# Patient Record
Sex: Male | Born: 1945 | Race: White | Hispanic: No | Marital: Married | Smoking: Never smoker
Health system: Southern US, Community
[De-identification: ages and names within clinical notes are randomized; demographics above are authoritative.]

## PROBLEM LIST (undated history)

## (undated) DIAGNOSIS — M316 Other giant cell arteritis: Secondary | ICD-10-CM

## (undated) DIAGNOSIS — I10 Essential (primary) hypertension: Secondary | ICD-10-CM

## (undated) DIAGNOSIS — E785 Hyperlipidemia, unspecified: Secondary | ICD-10-CM

## (undated) HISTORY — PX: NO PAST SURGERIES: SHX2092

---

## 2014-04-17 ENCOUNTER — Encounter (HOSPITAL_COMMUNITY): Payer: Self-pay | Admitting: Internal Medicine

## 2014-04-17 ENCOUNTER — Emergency Department (HOSPITAL_COMMUNITY): Payer: Medicare Other

## 2014-04-17 ENCOUNTER — Encounter (HOSPITAL_COMMUNITY): Admission: EM | Disposition: E | Payer: Self-pay | Source: Home / Self Care | Attending: Internal Medicine

## 2014-04-17 ENCOUNTER — Inpatient Hospital Stay (HOSPITAL_COMMUNITY)
Admission: EM | Admit: 2014-04-17 | Discharge: 2014-05-03 | DRG: 246 | Disposition: E | Payer: Medicare Other | Attending: Internal Medicine | Admitting: Internal Medicine

## 2014-04-17 DIAGNOSIS — Z79899 Other long term (current) drug therapy: Secondary | ICD-10-CM

## 2014-04-17 DIAGNOSIS — G40901 Epilepsy, unspecified, not intractable, with status epilepticus: Secondary | ICD-10-CM

## 2014-04-17 DIAGNOSIS — R6520 Severe sepsis without septic shock: Secondary | ICD-10-CM

## 2014-04-17 DIAGNOSIS — Z66 Do not resuscitate: Secondary | ICD-10-CM | POA: Diagnosis not present

## 2014-04-17 DIAGNOSIS — I5021 Acute systolic (congestive) heart failure: Secondary | ICD-10-CM | POA: Diagnosis present

## 2014-04-17 DIAGNOSIS — R001 Bradycardia, unspecified: Secondary | ICD-10-CM

## 2014-04-17 DIAGNOSIS — J690 Pneumonitis due to inhalation of food and vomit: Secondary | ICD-10-CM | POA: Diagnosis not present

## 2014-04-17 DIAGNOSIS — R4020 Unspecified coma: Secondary | ICD-10-CM | POA: Diagnosis present

## 2014-04-17 DIAGNOSIS — E872 Acidosis, unspecified: Secondary | ICD-10-CM | POA: Diagnosis present

## 2014-04-17 DIAGNOSIS — E876 Hypokalemia: Secondary | ICD-10-CM | POA: Diagnosis not present

## 2014-04-17 DIAGNOSIS — G931 Anoxic brain damage, not elsewhere classified: Secondary | ICD-10-CM | POA: Diagnosis present

## 2014-04-17 DIAGNOSIS — R34 Anuria and oliguria: Secondary | ICD-10-CM | POA: Diagnosis not present

## 2014-04-17 DIAGNOSIS — G40401 Other generalized epilepsy and epileptic syndromes, not intractable, with status epilepticus: Secondary | ICD-10-CM | POA: Diagnosis not present

## 2014-04-17 DIAGNOSIS — I2589 Other forms of chronic ischemic heart disease: Secondary | ICD-10-CM | POA: Diagnosis present

## 2014-04-17 DIAGNOSIS — I2109 ST elevation (STEMI) myocardial infarction involving other coronary artery of anterior wall: Secondary | ICD-10-CM

## 2014-04-17 DIAGNOSIS — I495 Sick sinus syndrome: Secondary | ICD-10-CM

## 2014-04-17 DIAGNOSIS — IMO0002 Reserved for concepts with insufficient information to code with codable children: Secondary | ICD-10-CM

## 2014-04-17 DIAGNOSIS — I498 Other specified cardiac arrhythmias: Secondary | ICD-10-CM | POA: Diagnosis not present

## 2014-04-17 DIAGNOSIS — I4901 Ventricular fibrillation: Principal | ICD-10-CM | POA: Diagnosis present

## 2014-04-17 DIAGNOSIS — I472 Ventricular tachycardia, unspecified: Secondary | ICD-10-CM | POA: Diagnosis not present

## 2014-04-17 DIAGNOSIS — Z8249 Family history of ischemic heart disease and other diseases of the circulatory system: Secondary | ICD-10-CM

## 2014-04-17 DIAGNOSIS — J9600 Acute respiratory failure, unspecified whether with hypoxia or hypercapnia: Secondary | ICD-10-CM | POA: Diagnosis present

## 2014-04-17 DIAGNOSIS — M316 Other giant cell arteritis: Secondary | ICD-10-CM | POA: Diagnosis present

## 2014-04-17 DIAGNOSIS — E785 Hyperlipidemia, unspecified: Secondary | ICD-10-CM | POA: Diagnosis present

## 2014-04-17 DIAGNOSIS — J9601 Acute respiratory failure with hypoxia: Secondary | ICD-10-CM

## 2014-04-17 DIAGNOSIS — R7309 Other abnormal glucose: Secondary | ICD-10-CM | POA: Diagnosis present

## 2014-04-17 DIAGNOSIS — T380X5A Adverse effect of glucocorticoids and synthetic analogues, initial encounter: Secondary | ICD-10-CM | POA: Diagnosis present

## 2014-04-17 DIAGNOSIS — I2510 Atherosclerotic heart disease of native coronary artery without angina pectoris: Secondary | ICD-10-CM

## 2014-04-17 DIAGNOSIS — R6521 Severe sepsis with septic shock: Secondary | ICD-10-CM

## 2014-04-17 DIAGNOSIS — I469 Cardiac arrest, cause unspecified: Secondary | ICD-10-CM

## 2014-04-17 DIAGNOSIS — I2582 Chronic total occlusion of coronary artery: Secondary | ICD-10-CM | POA: Diagnosis present

## 2014-04-17 DIAGNOSIS — I213 ST elevation (STEMI) myocardial infarction of unspecified site: Secondary | ICD-10-CM

## 2014-04-17 DIAGNOSIS — I4729 Other ventricular tachycardia: Secondary | ICD-10-CM | POA: Diagnosis not present

## 2014-04-17 DIAGNOSIS — I10 Essential (primary) hypertension: Secondary | ICD-10-CM | POA: Diagnosis present

## 2014-04-17 DIAGNOSIS — R570 Cardiogenic shock: Secondary | ICD-10-CM | POA: Diagnosis not present

## 2014-04-17 DIAGNOSIS — A419 Sepsis, unspecified organism: Secondary | ICD-10-CM

## 2014-04-17 DIAGNOSIS — J15211 Pneumonia due to Methicillin susceptible Staphylococcus aureus: Secondary | ICD-10-CM | POA: Diagnosis not present

## 2014-04-17 DIAGNOSIS — Z515 Encounter for palliative care: Secondary | ICD-10-CM

## 2014-04-17 HISTORY — DX: Hyperlipidemia, unspecified: E78.5

## 2014-04-17 HISTORY — DX: Other giant cell arteritis: M31.6

## 2014-04-17 HISTORY — DX: Essential (primary) hypertension: I10

## 2014-04-17 HISTORY — PX: LEFT HEART CATHETERIZATION WITH CORONARY ANGIOGRAM: SHX5451

## 2014-04-17 LAB — PROTIME-INR
INR: 1.13 (ref 0.00–1.49)
Prothrombin Time: 14.3 seconds (ref 11.6–15.2)

## 2014-04-17 LAB — BASIC METABOLIC PANEL
BUN: 14 mg/dL (ref 6–23)
CO2: 14 mEq/L — ABNORMAL LOW (ref 19–32)
Calcium: 8.2 mg/dL — ABNORMAL LOW (ref 8.4–10.5)
Chloride: 103 mEq/L (ref 96–112)
Creatinine, Ser: 1.24 mg/dL (ref 0.50–1.35)
GFR calc Af Amer: 68 mL/min — ABNORMAL LOW (ref >90)
GFR calc non Af Amer: 58 mL/min — ABNORMAL LOW (ref >90)
GLUCOSE: 253 mg/dL — AB (ref 70–99)
POTASSIUM: 3.3 meq/L — AB (ref 3.7–5.3)
Sodium: 141 mEq/L (ref 137–147)

## 2014-04-17 LAB — CBC
HEMATOCRIT: 36.7 % — AB (ref 39.0–52.0)
Hemoglobin: 12 g/dL — ABNORMAL LOW (ref 13.0–17.0)
MCH: 32.3 pg (ref 26.0–34.0)
MCHC: 32.7 g/dL (ref 30.0–36.0)
MCV: 98.9 fL (ref 78.0–100.0)
Platelets: 338 10*3/uL (ref 150–400)
RBC: 3.71 MIL/uL — AB (ref 4.22–5.81)
RDW: 14.2 % (ref 11.5–15.5)
WBC: 15.3 10*3/uL — AB (ref 4.0–10.5)

## 2014-04-17 LAB — APTT: aPTT: 34 seconds (ref 24–37)

## 2014-04-17 LAB — CBG MONITORING, ED: Glucose-Capillary: 220 mg/dL — ABNORMAL HIGH (ref 70–99)

## 2014-04-17 SURGERY — LEFT HEART CATHETERIZATION WITH CORONARY ANGIOGRAM
Anesthesia: LOCAL

## 2014-04-17 MED ORDER — HEPARIN SODIUM (PORCINE) 1000 UNIT/ML IJ SOLN
INTRAMUSCULAR | Status: AC
  Filled 2014-04-17: qty 1

## 2014-04-17 MED ORDER — CISATRACURIUM BOLUS VIA INFUSION
0.0500 mg/kg | INTRAVENOUS | Status: DC | PRN
  Filled 2014-04-17: qty 5

## 2014-04-17 MED ORDER — NOREPINEPHRINE BITARTRATE 1 MG/ML IV SOLN: 0.5000 ug/min | INTRAVENOUS | Status: DC

## 2014-04-17 MED ORDER — SODIUM CHLORIDE 0.9 % IV SOLN
2000.0000 mL | Freq: Once | INTRAVENOUS | Status: AC
  Administered 2014-04-18: 2000 mL via INTRAVENOUS

## 2014-04-17 MED ORDER — SODIUM CHLORIDE 0.9 % IV SOLN: 1.0000 ug/kg/min | INTRAVENOUS | Status: DC

## 2014-04-17 MED ORDER — METHYLPREDNISOLONE SODIUM SUCC 125 MG IJ SOLR
125.0000 mg | Freq: Once | INTRAMUSCULAR | Status: AC
  Filled 2014-04-17: qty 2

## 2014-04-17 MED ORDER — FENTANYL CITRATE 0.05 MG/ML IJ SOLN
INTRAMUSCULAR | Status: AC
  Filled 2014-04-17: qty 2

## 2014-04-17 MED ORDER — MIDAZOLAM HCL 5 MG/ML IJ SOLN
2.0000 mg | Freq: Once | INTRAMUSCULAR | Status: AC | PRN
Start: 2014-04-17 — End: 2014-04-17

## 2014-04-17 MED ORDER — SODIUM CHLORIDE 0.9 % IV SOLN
1.0000 ug/kg/min | INTRAVENOUS | Status: DC
Start: 2014-04-17 — End: 2014-04-18

## 2014-04-17 MED ORDER — CISATRACURIUM BOLUS VIA INFUSION: 0.0500 mg/kg | INTRAVENOUS | Status: DC | PRN

## 2014-04-17 MED ORDER — CISATRACURIUM BOLUS VIA INFUSION
0.1000 mg/kg | Freq: Once | INTRAVENOUS | Status: AC
  Administered 2014-04-17: 10 mg via INTRAVENOUS
  Filled 2014-04-17: qty 10

## 2014-04-17 MED ORDER — METHYLPREDNISOLONE SODIUM SUCC 125 MG IJ SOLR
INTRAMUSCULAR | Status: AC
  Filled 2014-04-17: qty 2

## 2014-04-17 MED ORDER — HEPARIN SODIUM (PORCINE) 5000 UNIT/ML IJ SOLN
INTRAMUSCULAR | Status: AC
  Administered 2014-04-17: 5000 [IU]
  Filled 2014-04-17: qty 1

## 2014-04-17 MED ORDER — SODIUM CHLORIDE 0.9 % IV SOLN
1.0000 ug/kg/min | INTRAVENOUS | Status: DC
  Administered 2014-04-18: 1 ug/kg/min via INTRAVENOUS
  Filled 2014-04-17: qty 20

## 2014-04-17 MED ORDER — FENTANYL BOLUS VIA INFUSION
50.0000 ug | INTRAVENOUS | Status: DC | PRN
  Filled 2014-04-17: qty 50

## 2014-04-17 MED ORDER — MIDAZOLAM BOLUS VIA INFUSION
2.0000 mg | INTRAVENOUS | Status: DC | PRN
  Filled 2014-04-17: qty 2

## 2014-04-17 MED ORDER — HYDROCORTISONE NA SUCCINATE PF 100 MG IJ SOLR
50.0000 mg | Freq: Four times a day (QID) | INTRAMUSCULAR | Status: DC
Start: 2014-04-17 — End: 2014-04-18
  Administered 2014-04-18: 50 mg via INTRAVENOUS
  Filled 2014-04-17 (×6): qty 1

## 2014-04-17 MED ORDER — VERAPAMIL HCL 2.5 MG/ML IV SOLN
INTRAVENOUS | Status: AC
  Filled 2014-04-17: qty 2

## 2014-04-17 MED ORDER — CISATRACURIUM BOLUS VIA INFUSION
0.1000 mg/kg | Freq: Once | INTRAVENOUS | Status: DC
  Filled 2014-04-17: qty 10

## 2014-04-17 MED ORDER — CISATRACURIUM BOLUS VIA INFUSION: 0.1000 mg/kg | Freq: Once | INTRAVENOUS | Status: DC

## 2014-04-17 MED ORDER — FENTANYL CITRATE 0.05 MG/ML IJ SOLN
100.0000 ug | Freq: Once | INTRAMUSCULAR | Status: AC
  Administered 2014-04-17: 100 ug via INTRAVENOUS

## 2014-04-17 MED ORDER — SODIUM CHLORIDE 0.9 % IV SOLN
25.0000 ug/h | INTRAVENOUS | Status: DC
  Administered 2014-04-18: 200 ug/h via INTRAVENOUS
  Administered 2014-04-18: 100 ug/h via INTRAVENOUS
  Administered 2014-04-19: 200 ug/h via INTRAVENOUS
  Filled 2014-04-17 (×4): qty 50

## 2014-04-17 MED ORDER — ARTIFICIAL TEARS OP OINT
1.0000 "application " | TOPICAL_OINTMENT | Freq: Three times a day (TID) | OPHTHALMIC | Status: DC
  Administered 2014-04-18 – 2014-04-19 (×5): 1 via OPHTHALMIC
  Filled 2014-04-17 (×2): qty 3.5

## 2014-04-17 MED ORDER — SODIUM CHLORIDE 0.9 % IV SOLN
2000.0000 mL | Freq: Once | INTRAVENOUS | Status: AC
  Administered 2014-04-17: 2000 mL via INTRAVENOUS

## 2014-04-17 MED ORDER — HEPARIN (PORCINE) IN NACL 2-0.9 UNIT/ML-% IJ SOLN
INTRAMUSCULAR | Status: AC
  Filled 2014-04-17: qty 1000

## 2014-04-17 MED ORDER — MIDAZOLAM HCL 2 MG/2ML IJ SOLN
INTRAMUSCULAR | Status: AC
  Filled 2014-04-17: qty 2

## 2014-04-17 MED ORDER — LIDOCAINE HCL (PF) 1 % IJ SOLN
INTRAMUSCULAR | Status: AC
  Filled 2014-04-17: qty 30

## 2014-04-17 MED ORDER — NOREPINEPHRINE BITARTRATE 1 MG/ML IV SOLN
0.0000 ug/min | INTRAVENOUS | Status: DC
  Administered 2014-04-19: 7 ug/min via INTRAVENOUS
  Administered 2014-04-19: 2 ug/min via INTRAVENOUS
  Administered 2014-04-19: 50 ug/min via INTRAVENOUS
  Filled 2014-04-17 (×4): qty 4

## 2014-04-17 MED ORDER — SODIUM CHLORIDE 0.9 % IV SOLN
1.0000 mg/h | INTRAVENOUS | Status: DC
  Administered 2014-04-18 (×2): 2 mg/h via INTRAVENOUS
  Administered 2014-04-18: 4 mg/h via INTRAVENOUS
  Filled 2014-04-17 (×6): qty 10

## 2014-04-17 MED ORDER — TIROFIBAN HCL IV 5 MG/100ML
INTRAVENOUS | Status: AC
  Filled 2014-04-17: qty 100

## 2014-04-17 MED ORDER — PANTOPRAZOLE SODIUM 40 MG IV SOLR
40.0000 mg | INTRAVENOUS | Status: DC
  Administered 2014-04-18 (×2): 40 mg via INTRAVENOUS
  Filled 2014-04-17 (×4): qty 40

## 2014-04-17 MED ORDER — FENTANYL CITRATE 0.05 MG/ML IJ SOLN: 100.0000 ug | Freq: Once | INTRAMUSCULAR | Status: AC | PRN

## 2014-04-17 MED ORDER — ASPIRIN 300 MG RE SUPP
300.0000 mg | RECTAL | Status: AC
  Administered 2014-04-17: 300 mg via RECTAL
  Filled 2014-04-17 (×2): qty 1

## 2014-04-17 MED ORDER — MIDAZOLAM HCL 5 MG/ML IJ SOLN
2.0000 mg | Freq: Once | INTRAMUSCULAR | Status: DC
Start: 2014-04-17 — End: 2014-04-18

## 2014-04-17 MED ORDER — FENTANYL CITRATE 0.05 MG/ML IJ SOLN: 100.0000 ug | Freq: Once | INTRAMUSCULAR | Status: DC

## 2014-04-17 NOTE — ED Notes (Signed)
Per EMS: pt coming from home with c/o cardiac arrest. Wife heard patient fall in shower, started CPR, EMS arrived at 2158 with in approximately 8 minutes of 911 call, EMS found pt in VF, shocked 200, 300, 360 joules. EMS CPR started at 2200 and ROSC at 2205. Pt lost pulses again, shocked again then ROSC at 2210. Pt was given epi x 2, 300 mg amio. IO in left tib, pt intubated with ET #8

## 2014-04-17 NOTE — H&P (Signed)
PULMONARY / CRITICAL CARE MEDICINE   Name: Andre Moran MRN: [id number] DOB: [birth year]    ADMISSION DATE:  01/07/14 CONSULTATION DATE:  01/07/14  REFERRING MD :  EDP PRIMARY SERVICE: PCCM  CHIEF COMPLAINT:  VF cardiac arrest  BRIEF PATIENT DESCRIPTION: 68 y.o. M who suffered v.fib arrest at home with ROSC in 15 minutes.  Brought to ED, GCS 3 and found to have STEMI.  Cardiology to take pt to cath lab tonight.  PCCM consulted for admission / initiation of hypothermia.  SIGNIFICANT EVENTS / STUDIES:  6/15 - suffered out of hospital v.fib arrest.  Shocked 4 times by EMS.  Given epi x 2, 300mg  amio.  ROSC 15 min.  Hypothermia protocol started and pt taken to cath lab.  LINES / TUBES: OETT 6/15 >>> OGT 6/15 >>> Foley 6/15 >>> Central line planned 6/16  >>> A-line planned >>>  CULTURES: None   ANTIBIOTICS: None  HISTORY OF PRESENT ILLNESS:  Pt is encephalopathic; therefore, this HPI is obtained from chart review. Andre Moran is a 68 y.o. M with PMH of HTN, HLD, temporal arteritis (on 8mg  prednisone daily and methotrexate).  Per his wife, if pt has temporal arteritis flair, he usually has jaw pain mainly with chewing.  On 6/15, he had been complaining of left jaw pain that was not like his usual temporal arteritis pain.  He had no chest pain, SOB, N/V, diaphoresis. Later that evening, wife was in shower and suddenly heard a loud noise.  After getting out of shower, she noticed her husband was down and unresponsive.  She immediately began CPR and called EMS.  EMS arrived within 8 minutes of call and found pt to be in VF.  He was shocked 3 times with ROSC in 8 minutes.  He then proceeded to lose pulses and was shocked again with next ROSC in 5 minutes.  During resuscitation, he was given epi x 2 and amiodarone 300mg . In ED, EKG revealed acute STEMI.  Cardiology was consulted and they will take pt to cath lab STAT.  GCS in ED was 3 and pt was subsequently started on hypothermia protocol.   PCCM was consulted for admission.  PAST MEDICAL HISTORY :  No past medical history on file. No past surgical history on file. Prior to Admission medications   Not on File   Allergies not on file  FAMILY HISTORY:  No family history on file. SOCIAL HISTORY:  has no tobacco, alcohol, and drug history on file.  REVIEW OF SYSTEMS:  Unable to complete as pt is non-responsive.   SUBJECTIVE: Vitals stable.  Cooling started, pt getting ready to be transported to cath lab.  VITAL SIGNS: Temp:  [95.2 F (35.1 C)-98.3 F (36.8 C)] 95.2 F (35.1 C) (06/15 2300) Pulse Rate:  [79-81] 79 (06/15 2300) Resp:  [24-27] 27 (06/15 2300) BP: (120-122)/(74-79) 122/79 mmHg (06/15 2300) SpO2:  [97 %-100 %] 97 % (06/15 2300) FiO2 (%):  [60 %] 60 % (06/15 2300) Weight:  [99.791 kg (220 lb)] 99.791 kg (220 lb) (06/15 2245) HEMODYNAMICS:   VENTILATOR SETTINGS: Vent Mode:  [-] PRVC FiO2 (%):  [60 %] 60 % Set Rate:  [18 bmp] 18 bmp Vt Set:  [550 mL] 550 mL PEEP:  [5 cmH20] 5 cmH20 INTAKE / OUTPUT: Intake/Output   None     PHYSICAL EXAMINATION: General: WDWN male, unresponsive in stretcher. Neuro: GCS 3. HEENT: /AT. PERRL, sclerae anicteric. Cardiovascular: RRR, no M/R/G.  Lungs: Respirations even and unlabored.  CTA bilaterally, No  W/R/R.  Abdomen: BS x 4, soft, NT/ND.  Musculoskeletal: No gross deformities, no edema.  IO in LLE. Skin: Intact, warm, no rashes.    LABS: PULMONARY No results found for this basename: PHART, PCO2, PCO2ART, PO2, PO2ART, HCO3, TCO2, O2SAT,  in the last 168 hours  CBC  Recent Labs Lab 05-01-2014 2254  HGB 12.0*  HCT 36.7*  WBC 15.3*  PLT 338    COAGULATION  Recent Labs Lab 05/01/14 2254  INR 1.13    CARDIAC  No results found for this basename: TROPONINI,  in the last 168 hours No results found for this basename: PROBNP,  in the last 168 hours   CHEMISTRY  Recent Labs Lab 01-May-2014 2254  NA 141  K 3.3*  CL 103  CO2 14*  GLUCOSE  253*  BUN 14  CREATININE 1.24  CALCIUM 8.2*   Estimated Creatinine Clearance: 71.9 ml/min (by C-G formula based on Cr of 1.24).   LIVER  Recent Labs Lab May 01, 2014 2254  INR 1.13     INFECTIOUS No results found for this basename: LATICACIDVEN, PROCALCITON,  in the last 168 hours   ENDOCRINE CBG (last 3)   Recent Labs  05-01-2014 2243  GLUCAP 220*         IMAGING x48h  No results found.     ASSESSMENT / PLAN:  PULMONARY A: Acute respiratory failure - in setting of v.fib cardiac arrest P:   - Full mechanical support. - VAP bundle. - No SBT while undergoing hypothermia protocol. - ABG and CXR in AM.  CARDIOVASCULAR A:  S/p V.Fib arrest STEMI Hx HTN Hx HLD Hx temporal arteritis - on prednisone daily, see endocrine section below. P:  - Cards consulted, will take pt to cath lab tonight.  Appreciate the assistance. - Full dose heparin. - Initiate hypothermia protocol. - Goal MAP > 85 during cooling. - Trend troponin / lactate. - TTE.  RENAL A:   No known issues. P:   - F/u on BMP results. - BMP q2hrs while cooling.  GASTROINTESTINAL A:   Nutrition GI prophylaxis P:   - NPO as intubated. - Pantoprazole. - Nutrition consult for TF's.  HEMATOLOGIC A:  No acute issues No role VTE prophylaxis - on full dose heparin P:  - CBC in AM.  INFECTIOUS A:   No acute issues P:   - Monitor WBC count / fever curve.  ENDOCRINE A:   At risk AI - on prednisone daily for temporal arteritis. Hyperglycemia P:   - Stress steroids. - ICU hyperglycemia protocol during cooling.  NEUROLOGIC A:   Acute encephalopathy P:   - Sedation:  Versed / Fentanyl. - Nimbex. - Goal RASS - 2 to -3. - No daily WUA's while under hypothermia protocol.   Rutherford Guys, [place] - [facility] [place] [facility] Pgr: [phone]  or [phone]  05/01/14, 11:30 PM    STAFF NOTE: I, Dr Lavinia Sharps have personally reviewed patient's  available data, including medical history, events of note, physical examination and test results as part of my evaluation. I have discussed with resident/NP and other care providers such as pharmacist, RN and RRT.  In addition,  I personally evaluated patient and elicited key findings of  V Fib arrest with bystander CPR and short time to ROSC. Unresponsive. Has LAD lesion in cath labs and will be s/p PTCA and stent. Appropriate for induced hypothermia protocol.  Rest per NP/medical resident whose note is outlined above  and that I agree with  The patient is critically ill with multiple organ systems failure and requires high complexity decision making for assessment and support, frequent evaluation and titration of therapies, application of advanced monitoring technologies and extensive interpretation of multiple databases.   Critical Care Time devoted to patient care services described in this note is  35  Minutes.  Dr. Kalman Shan, M.D., [facility].C.P Pulmonary and Critical Care Medicine Staff Physician [facility] Pager: [phone], If no answer or between  15:00h - 7:00h: call [phone]  04/18/2014 12:24 AM

## 2014-04-17 NOTE — Consult Note (Addendum)
Referring Physician: ER and CCM Primary Physician: No primary provider on file. Primary Cardiologist: none Reason for Consultation: evaluation and management of a patient with anterior STEMI s/p Vfib arrest  HPI: This is a 68 year old Caucasian gentleman with past medical history of hypertension, hyperlipidemia, temporal arteritis on chronic prednisone therapy. Who was brought to [facility] emergency department after sustaining out of hospital ventricular fibrillation arrest. Apparently around 10 PM his wife heard the sound of a foreign body. She immediately started CPR and called EMS. According to EMS estimates total down time was about 15 minutes before regaining spontaneous circulation. Patient received CPR initially by his wife as mentioned above and then by paramedics. He received 300 mg of amiodarone IV, 2 mg of epinephrine IV as well as 4-5 defibrillations for ventricular fibrillation as well as one time for ventricular tachycardia. At the time of my arrival patient was already intubated and mechanically ventilated. His EKG showed ST elevation in leads V3 and V4. Cold STEMI was called and patient was brought emergently to the cardiac cath lab. He was found to have proximal to mid LAD occlusion and received 2 DES to prox-mid LAD  Apparently patient was having more jaw pain which did not quite sound like his typical jaw claudication from temporal arteritis over 24 hours prior to presentation. He is visiting from [place]. No other complaints according to the formation provided by his wife.  Review of Systems:  12 systems were reviewed (per wife) and were negative except mentioned in the HPI     Past Medical History  Diagnosis Date  . HTN (hypertension)   . HLD (hyperlipidemia)   . Temporal arteritis    Past Surgical History  Procedure Laterality Date  . No past surgeries       Current Medications: . [MAR HOLD] sodium chloride  2,000 mL Intravenous Once  . [facility] HOLD]  artificial tears  1 application Both Eyes 3 times per day  . fentaNYL      . [facility] HOLD] fentaNYL  100 mcg Intravenous Once  . [facility] HOLD] hydrocortisone sodium succinate  50 mg Intravenous Q6H  . methylPREDNISolone (SOLU-MEDROL) injection  125 mg Intravenous Once  . midazolam      . [MAR HOLD] midazolam  2 mg Intravenous Once  . [MAR HOLD] pantoprazole (PROTONIX) IV  40 mg Intravenous Q24H   Infusions:  . fentaNYL infusion INTRAVENOUS    . midazolam (VERSED) infusion      No prescriptions prior to admission     Allergies not on file  History   Social History  . Marital Status: Married    Spouse Name: N/A    Number of Children: N/A  . Years of Education: N/A   Occupational History  . Not on file.   Social History Main Topics  . Smoking status: Never Smoker   . Smokeless tobacco: Not on file  . Alcohol Use: No  . Drug Use: No  . Sexual Activity: Not on file   Other Topics Concern  . Not on file   Social History Narrative  . No narrative on file    Family History  Problem Relation Age of Onset  . Heart attack Father 16  . Coronary artery disease Mother     5 stents   No family status information on file.    PHYSICAL EXAM: Filed Vitals:   04/24/2014 2300  BP: 122/79  Pulse: 79  Temp: 95.2 F (35.1 C)  Resp: 27   General:  Intubated and mechanically ventilated, appears dyssynchronous on the ventilator HEENT: T tube in place Neck: supple. no JVD. Carotids 2+ bilat; no bruits. No lymphadenopathy or thryomegaly appreciated. Cor: PMI nondisplaced. Regular rate & rhythm. No rubs, gallops or murmurs. Lungs: Inflated lung sounds Abdomen: soft, nontender, nondistended. No hepatosplenomegaly. No bruits or masses. Good bowel sounds. Extremities: no cyanosis, clubbing, rash, edema Neuro: Date, mechanically ventilated, Glasgow Coma Scale of 3 at the time of arrival  Results for orders placed during the hospital encounter of May 06, 2014 (from the past 24 hour(s))  CBG  MONITORING, ED     Status: Abnormal   Collection Time    May 06, 2014 10:43 PM      Result Value Ref Range   Glucose-Capillary 220 (*) 70 - 99 mg/dL  CBC     Status: Abnormal   Collection Time    May 06, 2014 10:54 PM      Result Value Ref Range   WBC 15.3 (*) 4.0 - 10.5 K/uL   RBC 3.71 (*) 4.22 - 5.81 MIL/uL   Hemoglobin 12.0 (*) 13.0 - 17.0 g/dL   HCT 25.9 (*) 56.3 - 87.5 %   MCV 98.9  78.0 - 100.0 fL   MCH 32.3  26.0 - 34.0 pg   MCHC 32.7  30.0 - 36.0 g/dL   RDW 64.3  32.9 - 51.8 %   Platelets 338  150 - 400 K/uL  APTT     Status: None   Collection Time    2014-05-06 10:54 PM      Result Value Ref Range   aPTT 34  24 - 37 seconds  PROTIME-INR     Status: None   Collection Time    May 06, 2014 10:54 PM      Result Value Ref Range   Prothrombin Time 14.3  11.6 - 15.2 seconds   INR 1.13  0.00 - 1.49   ECG: Sinus rhythm ST elevation in V3 and V4 ECHO: limited bedside echo with preserved LVEF, septal segments hypokinesis. LHC: occluded proximal to mid LAD - s/p 2 Xience Alpine stents. LVEDP=28 mm Hg (severely elevated), LVEF=30% per LV gram. Severe mid to apical akineis with preserved basal segments contractility. Almost takotsubo pattern (even though LAD was a large vessel - LCX and RCA gives branches all the way to the LV apex)  ASSESSMENT:  Anterior STEMI S/p 2 DES to proximal - mid LAD. - Asa 81 mg daily  for at least 12 months - prasugrel 10 mg daily for at least 12 months - lipitor 80 mg daily - Aggrastat for 18 hours if there are no bleeding complications.  Severe LV dysfunction  LVEF=30% per LV gram s/p stent LVEDP=28 mm Hg Recommend diuresis with IV lasix to keep CVP <10 mm Hg if central line is present and inadequate UOP  Cardiac arrest Management per CCM, including TTM     Aaronmichael Brumbaugh, MD 05-06-2014 11:33 PM  I was called to assess the patient with slow ~90-95 bpm wide complex rhythm. Apparently patient received IV metoprolol 5 mg for BP of 170/90 while still  being actively cooled.   Per telemetry review pt with sinus bradycardia down to 40 bpm and frequent escape beats and runs of AIVR.   Impression: Sinus bradycardia -due to metoprolol and hypothermia Accelerated Idioventricular rhythm - due to hypothermia, myocardial reperfusion and electrolyte imbalance Hypokaliemia, hypomagnesiemia, Hypocalcemia  Plan: 40 meq KCL PT + 40 mEQ IV Ca 2 gr IV Magnesium 4 gr IV No antiarrhythmic Nitro gtt on stand by  if HR>60 and patient is hypertensive

## 2014-04-18 ENCOUNTER — Inpatient Hospital Stay (HOSPITAL_COMMUNITY): Payer: Medicare Other

## 2014-04-18 ENCOUNTER — Other Ambulatory Visit: Payer: Self-pay

## 2014-04-18 DIAGNOSIS — J9601 Acute respiratory failure with hypoxia: Secondary | ICD-10-CM | POA: Diagnosis present

## 2014-04-18 DIAGNOSIS — I498 Other specified cardiac arrhythmias: Secondary | ICD-10-CM

## 2014-04-18 DIAGNOSIS — R001 Bradycardia, unspecified: Secondary | ICD-10-CM | POA: Diagnosis present

## 2014-04-18 DIAGNOSIS — I219 Acute myocardial infarction, unspecified: Secondary | ICD-10-CM

## 2014-04-18 DIAGNOSIS — IMO0002 Reserved for concepts with insufficient information to code with codable children: Secondary | ICD-10-CM

## 2014-04-18 DIAGNOSIS — I517 Cardiomegaly: Secondary | ICD-10-CM

## 2014-04-18 LAB — POCT I-STAT, CHEM 8
BUN: 11 mg/dL (ref 6–23)
BUN: 11 mg/dL (ref 6–23)
BUN: 12 mg/dL (ref 6–23)
BUN: 9 mg/dL (ref 6–23)
BUN: 10 mg/dL (ref 6–23)
CALCIUM ION: 1.15 mmol/L (ref 1.13–1.30)
CALCIUM ION: 1.12 mmol/L — AB (ref 1.13–1.30)
CALCIUM ION: 1.1 mmol/L — AB (ref 1.13–1.30)
CALCIUM ION: 1.09 mmol/L — AB (ref 1.13–1.30)
CHLORIDE: 114 meq/L — AB (ref 96–112)
CHLORIDE: 103 meq/L (ref 96–112)
Calcium, Ion: 1.09 mmol/L — ABNORMAL LOW (ref 1.13–1.30)
Chloride: 111 mEq/L (ref 96–112)
Chloride: 105 mEq/L (ref 96–112)
Chloride: 104 mEq/L (ref 96–112)
Creatinine, Ser: 0.7 mg/dL (ref 0.50–1.35)
Creatinine, Ser: 0.5 mg/dL (ref 0.50–1.35)
Creatinine, Ser: 0.7 mg/dL (ref 0.50–1.35)
Creatinine, Ser: 0.6 mg/dL (ref 0.50–1.35)
Creatinine, Ser: 0.5 mg/dL (ref 0.50–1.35)
GLUCOSE: 230 mg/dL — AB (ref 70–99)
GLUCOSE: 277 mg/dL — AB (ref 70–99)
GLUCOSE: 230 mg/dL — AB (ref 70–99)
GLUCOSE: 188 mg/dL — AB (ref 70–99)
Glucose, Bld: 215 mg/dL — ABNORMAL HIGH (ref 70–99)
HCT: 47 % (ref 39.0–52.0)
HCT: 49 % (ref 39.0–52.0)
HCT: 48 % (ref 39.0–52.0)
HEMATOCRIT: 47 % (ref 39.0–52.0)
HEMATOCRIT: 48 % (ref 39.0–52.0)
HEMOGLOBIN: 16 g/dL (ref 13.0–17.0)
Hemoglobin: 16 g/dL (ref 13.0–17.0)
Hemoglobin: 16.7 g/dL (ref 13.0–17.0)
Hemoglobin: 16.3 g/dL (ref 13.0–17.0)
Hemoglobin: 16.3 g/dL (ref 13.0–17.0)
POTASSIUM: 3.3 meq/L — AB (ref 3.7–5.3)
Potassium: 5.1 mEq/L (ref 3.7–5.3)
Potassium: 4 mEq/L (ref 3.7–5.3)
Potassium: 3.2 mEq/L — ABNORMAL LOW (ref 3.7–5.3)
Potassium: 3.7 mEq/L (ref 3.7–5.3)
SODIUM: 136 meq/L — AB (ref 137–147)
Sodium: 137 mEq/L (ref 137–147)
Sodium: 141 mEq/L (ref 137–147)
Sodium: 140 mEq/L (ref 137–147)
Sodium: 139 mEq/L (ref 137–147)
TCO2: 17 mmol/L (ref 0–100)
TCO2: 15 mmol/L (ref 0–100)
TCO2: 23 mmol/L (ref 0–100)
TCO2: 18 mmol/L (ref 0–100)
TCO2: 18 mmol/L (ref 0–100)

## 2014-04-18 LAB — BASIC METABOLIC PANEL
BUN: 13 mg/dL (ref 6–23)
BUN: 12 mg/dL (ref 6–23)
BUN: 12 mg/dL (ref 6–23)
BUN: 11 mg/dL (ref 6–23)
CALCIUM: 7.8 mg/dL — AB (ref 8.4–10.5)
CALCIUM: 8.3 mg/dL — AB (ref 8.4–10.5)
CALCIUM: 8.4 mg/dL (ref 8.4–10.5)
CHLORIDE: 104 meq/L (ref 96–112)
CO2: 20 mEq/L (ref 19–32)
CO2: 17 mEq/L — ABNORMAL LOW (ref 19–32)
CO2: 19 mEq/L (ref 19–32)
CO2: 17 mEq/L — ABNORMAL LOW (ref 19–32)
CREATININE: 0.67 mg/dL (ref 0.50–1.35)
Calcium: 8 mg/dL — ABNORMAL LOW (ref 8.4–10.5)
Chloride: 102 mEq/L (ref 96–112)
Chloride: 104 mEq/L (ref 96–112)
Chloride: 101 mEq/L (ref 96–112)
Creatinine, Ser: 0.79 mg/dL (ref 0.50–1.35)
Creatinine, Ser: 0.67 mg/dL (ref 0.50–1.35)
Creatinine, Ser: 0.53 mg/dL (ref 0.50–1.35)
GFR calc Af Amer: >90 mL/min (ref >90)
GFR calc Af Amer: >90 mL/min (ref >90)
GFR calc Af Amer: >90 mL/min (ref >90)
GFR calc non Af Amer: >90 mL/min (ref >90)
GFR calc non Af Amer: >90 mL/min (ref >90)
GLUCOSE: 234 mg/dL — AB (ref 70–99)
Glucose, Bld: 173 mg/dL — ABNORMAL HIGH (ref 70–99)
Glucose, Bld: 225 mg/dL — ABNORMAL HIGH (ref 70–99)
Glucose, Bld: 166 mg/dL — ABNORMAL HIGH (ref 70–99)
POTASSIUM: 4 meq/L (ref 3.7–5.3)
Potassium: 3.5 mEq/L — ABNORMAL LOW (ref 3.7–5.3)
Potassium: 4.1 mEq/L (ref 3.7–5.3)
Potassium: 3.5 mEq/L — ABNORMAL LOW (ref 3.7–5.3)
SODIUM: 137 meq/L (ref 137–147)
Sodium: 142 mEq/L (ref 137–147)
Sodium: 138 mEq/L (ref 137–147)
Sodium: 140 mEq/L (ref 137–147)

## 2014-04-18 LAB — CBC
HCT: 41.3 % (ref 39.0–52.0)
HEMOGLOBIN: 14 g/dL (ref 13.0–17.0)
MCH: 32.9 pg (ref 26.0–34.0)
MCHC: 33.9 g/dL (ref 30.0–36.0)
MCV: 97.2 fL (ref 78.0–100.0)
PLATELETS: 344 10*3/uL (ref 150–400)
RBC: 4.25 MIL/uL (ref 4.22–5.81)
RDW: 14.3 % (ref 11.5–15.5)
WBC: 21.3 10*3/uL — AB (ref 4.0–10.5)

## 2014-04-18 LAB — PHOSPHORUS
Phosphorus: 1.4 mg/dL — ABNORMAL LOW (ref 2.3–4.6)
Phosphorus: 1 mg/dL — CL (ref 2.3–4.6)

## 2014-04-18 LAB — POCT I-STAT 3, ART BLOOD GAS (G3+)
ACID-BASE DEFICIT: 7 mmol/L — AB (ref 0.0–2.0)
Acid-base deficit: 7 mmol/L — ABNORMAL HIGH (ref 0.0–2.0)
Acid-base deficit: 13 mmol/L — ABNORMAL HIGH (ref 0.0–2.0)
Acid-base deficit: 7 mmol/L — ABNORMAL HIGH (ref 0.0–2.0)
BICARBONATE: 16.3 meq/L — AB (ref 20.0–24.0)
BICARBONATE: 17.1 meq/L — AB (ref 20.0–24.0)
BICARBONATE: 19.5 meq/L — AB (ref 20.0–24.0)
Bicarbonate: 16.4 mEq/L — ABNORMAL LOW (ref 20.0–24.0)
O2 SAT: 95 %
O2 SAT: 91 %
O2 Saturation: 100 %
O2 Saturation: 99 %
PCO2 ART: 23.1 mmHg — AB (ref 35.0–45.0)
PCO2 ART: 24.8 mmHg — AB (ref 35.0–45.0)
PCO2 ART: 43.5 mmHg (ref 35.0–45.0)
PH ART: 7.1 — AB (ref 7.350–7.450)
PH ART: 7.438 (ref 7.350–7.450)
PO2 ART: 160 mmHg — AB (ref 80.0–100.0)
PO2 ART: 47 mmHg — AB (ref 80.0–100.0)
Patient temperature: 33.4
Patient temperature: 32.8
TCO2: 18 mmol/L (ref 0–100)
TCO2: 17 mmol/L (ref 0–100)
TCO2: 18 mmol/L (ref 0–100)
TCO2: 21 mmol/L (ref 0–100)
pCO2 arterial: 52.9 mmHg — ABNORMAL HIGH (ref 35.0–45.0)
pH, Arterial: 7.431 (ref 7.350–7.450)
pH, Arterial: 7.261 — ABNORMAL LOW (ref 7.350–7.450)
pO2, Arterial: 58 mmHg — ABNORMAL LOW (ref 80.0–100.0)
pO2, Arterial: 200 mmHg — ABNORMAL HIGH (ref 80.0–100.0)

## 2014-04-18 LAB — GLUCOSE, CAPILLARY
GLUCOSE-CAPILLARY: 209 mg/dL — AB (ref 70–99)
GLUCOSE-CAPILLARY: 199 mg/dL — AB (ref 70–99)
GLUCOSE-CAPILLARY: 185 mg/dL — AB (ref 70–99)
GLUCOSE-CAPILLARY: 182 mg/dL — AB (ref 70–99)
Glucose-Capillary: 140 mg/dL — ABNORMAL HIGH (ref 70–99)
Glucose-Capillary: 166 mg/dL — ABNORMAL HIGH (ref 70–99)
Glucose-Capillary: 189 mg/dL — ABNORMAL HIGH (ref 70–99)
Glucose-Capillary: 225 mg/dL — ABNORMAL HIGH (ref 70–99)
Glucose-Capillary: 210 mg/dL — ABNORMAL HIGH (ref 70–99)
Glucose-Capillary: 206 mg/dL — ABNORMAL HIGH (ref 70–99)
Glucose-Capillary: 190 mg/dL — ABNORMAL HIGH (ref 70–99)
Glucose-Capillary: 200 mg/dL — ABNORMAL HIGH (ref 70–99)

## 2014-04-18 LAB — POCT ACTIVATED CLOTTING TIME
ACTIVATED CLOTTING TIME: 420 s
Activated Clotting Time: 188 seconds

## 2014-04-18 LAB — PRO B NATRIURETIC PEPTIDE: Pro B Natriuretic peptide (BNP): 737.1 pg/mL — ABNORMAL HIGH (ref 0–125)

## 2014-04-18 LAB — TROPONIN I
Troponin I: >20 ng/mL (ref <0.30)
Troponin I: >20 ng/mL (ref <0.30)

## 2014-04-18 LAB — MAGNESIUM
MAGNESIUM: 1.7 mg/dL (ref 1.5–2.5)
Magnesium: 2.4 mg/dL (ref 1.5–2.5)

## 2014-04-18 LAB — APTT: aPTT: 37 seconds (ref 24–37)

## 2014-04-18 LAB — MRSA PCR SCREENING: MRSA by PCR: NEGATIVE

## 2014-04-18 LAB — PLATELET COUNT: Platelets: 373 10*3/uL (ref 150–400)

## 2014-04-18 LAB — LACTIC ACID, PLASMA
LACTIC ACID, VENOUS: 1.6 mmol/L (ref 0.5–2.2)
Lactic Acid, Venous: 2.6 mmol/L — ABNORMAL HIGH (ref 0.5–2.2)

## 2014-04-18 MED ORDER — MAGNESIUM SULFATE 4000MG/100ML IJ SOLN
4.0000 g | Freq: Once | INTRAMUSCULAR | Status: AC
Start: 2014-04-18 — End: 2014-04-18
  Administered 2014-04-18: 4 g via INTRAVENOUS
  Filled 2014-04-18: qty 100

## 2014-04-18 MED ORDER — BIOTENE DRY MOUTH MT LIQD
15.0000 mL | Freq: Four times a day (QID) | OROMUCOSAL | Status: DC
  Administered 2014-04-18 – 2014-04-19 (×6): 15 mL via OROMUCOSAL

## 2014-04-18 MED ORDER — TIROFIBAN HCL IV 5 MG/100ML
0.1500 ug/kg/min | INTRAVENOUS | Status: AC
  Administered 2014-04-18 (×2): 0.15 ug/kg/min via INTRAVENOUS
  Filled 2014-04-18 (×6): qty 100

## 2014-04-18 MED ORDER — CISATRACURIUM BOLUS VIA INFUSION
0.0500 mg/kg | INTRAVENOUS | Status: DC | PRN
  Filled 2014-04-18: qty 5

## 2014-04-18 MED ORDER — HEPARIN SODIUM (PORCINE) 5000 UNIT/ML IJ SOLN
5000.0000 [IU] | Freq: Three times a day (TID) | INTRAMUSCULAR | Status: DC
  Administered 2014-04-18 – 2014-04-19 (×3): 5000 [IU] via SUBCUTANEOUS
  Filled 2014-04-18 (×5): qty 1

## 2014-04-18 MED ORDER — NITROGLYCERIN IN D5W 200-5 MCG/ML-% IV SOLN
2.0000 ug/min | INTRAVENOUS | Status: DC
  Administered 2014-04-18: 10 ug/min via INTRAVENOUS
  Filled 2014-04-18: qty 250

## 2014-04-18 MED ORDER — LACTATED RINGERS IV SOLN: INTRAVENOUS | Status: DC

## 2014-04-18 MED ORDER — POTASSIUM CHLORIDE 20 MEQ/15ML (10%) PO LIQD
40.0000 meq | Freq: Once | ORAL | Status: AC
  Administered 2014-04-18: 40 meq via ORAL
  Filled 2014-04-18: qty 30

## 2014-04-18 MED ORDER — POTASSIUM CHLORIDE 20 MEQ/15ML (10%) PO LIQD
ORAL | Status: AC
  Filled 2014-04-18: qty 30

## 2014-04-18 MED ORDER — SODIUM CHLORIDE 0.9 % IV SOLN
2.0000 g | Freq: Once | INTRAVENOUS | Status: AC
  Administered 2014-04-18: 2 g via INTRAVENOUS
  Filled 2014-04-18: qty 20

## 2014-04-18 MED ORDER — SODIUM CHLORIDE 0.9 % IV SOLN
INTRAVENOUS | Status: DC
  Administered 2014-04-18: 03:00:00 via INTRAVENOUS

## 2014-04-18 MED ORDER — ASPIRIN 81 MG PO CHEW
81.0000 mg | CHEWABLE_TABLET | Freq: Every day | ORAL | Status: DC
  Administered 2014-04-18: 81 mg via ORAL
  Filled 2014-04-18: qty 1

## 2014-04-18 MED ORDER — ASPIRIN 81 MG PO CHEW
81.0000 mg | CHEWABLE_TABLET | Freq: Every day | ORAL | Status: DC
  Administered 2014-04-19: 81 mg
  Filled 2014-04-18: qty 1

## 2014-04-18 MED ORDER — PRASUGREL HCL 10 MG PO TABS
60.0000 mg | ORAL_TABLET | Freq: Once | ORAL | Status: AC
  Administered 2014-04-18: 60 mg via ORAL
  Filled 2014-04-18: qty 6

## 2014-04-18 MED ORDER — HYDROCORTISONE NA SUCCINATE PF 100 MG IJ SOLR
50.0000 mg | Freq: Four times a day (QID) | INTRAMUSCULAR | Status: DC
  Administered 2014-04-18 – 2014-04-19 (×6): 50 mg via INTRAVENOUS
  Filled 2014-04-18 (×8): qty 1

## 2014-04-18 MED ORDER — METOPROLOL TARTRATE 1 MG/ML IV SOLN
5.0000 mg | Freq: Four times a day (QID) | INTRAVENOUS | Status: DC
  Administered 2014-04-18: 5 mg via INTRAVENOUS
  Filled 2014-04-18: qty 5

## 2014-04-18 MED ORDER — TIROFIBAN HCL IV 5 MG/100ML
0.1500 ug/kg/min | INTRAVENOUS | Status: DC
  Administered 2014-04-18: 0.15 ug/kg/min via INTRAVENOUS
  Filled 2014-04-18 (×2): qty 100

## 2014-04-18 MED ORDER — POTASSIUM CHLORIDE 10 MEQ/50ML IV SOLN
10.0000 meq | INTRAVENOUS | Status: AC
Start: 2014-04-18 — End: 2014-04-18
  Administered 2014-04-18 (×2): 10 meq via INTRAVENOUS
  Filled 2014-04-18 (×2): qty 50

## 2014-04-18 MED ORDER — POTASSIUM CHLORIDE 10 MEQ/50ML IV SOLN
10.0000 meq | INTRAVENOUS | Status: AC
  Administered 2014-04-18 (×4): 10 meq via INTRAVENOUS
  Filled 2014-04-18: qty 50

## 2014-04-18 MED ORDER — HYDRALAZINE HCL 20 MG/ML IJ SOLN
10.0000 mg | INTRAMUSCULAR | Status: DC | PRN
  Administered 2014-04-18: 20 mg via INTRAVENOUS
  Filled 2014-04-18: qty 1

## 2014-04-18 MED ORDER — PRASUGREL HCL 10 MG PO TABS
10.0000 mg | ORAL_TABLET | Freq: Every day | ORAL | Status: DC
  Administered 2014-04-18 – 2014-04-19 (×2): 10 mg via ORAL
  Filled 2014-04-18 (×3): qty 1

## 2014-04-18 MED ORDER — MAGNESIUM SULFATE 40 MG/ML IJ SOLN
INTRAMUSCULAR | Status: AC
  Filled 2014-04-18: qty 50

## 2014-04-18 MED ORDER — ATORVASTATIN CALCIUM 80 MG PO TABS
80.0000 mg | ORAL_TABLET | Freq: Every day | ORAL | Status: DC
  Administered 2014-04-18 – 2014-04-19 (×3): 80 mg
  Filled 2014-04-18 (×3): qty 1

## 2014-04-18 MED ORDER — ACETAMINOPHEN 325 MG PO TABS: 650.0000 mg | ORAL_TABLET | ORAL | Status: DC | PRN

## 2014-04-18 MED ORDER — SODIUM CHLORIDE 0.9 % IV SOLN
1.0000 ug/kg/min | INTRAVENOUS | Status: DC
  Administered 2014-04-19: 1.5 ug/kg/min via INTRAVENOUS
  Filled 2014-04-18: qty 20

## 2014-04-18 MED ORDER — SODIUM BICARBONATE 8.4 % IV SOLN
INTRAVENOUS | Status: AC
Start: 2014-04-18 — End: 2014-04-18
  Filled 2014-04-18: qty 50

## 2014-04-18 MED ORDER — ONDANSETRON HCL 4 MG/2ML IJ SOLN: 4.0000 mg | Freq: Four times a day (QID) | INTRAMUSCULAR | Status: DC | PRN

## 2014-04-18 MED ORDER — K PHOS MONO-SOD PHOS DI & MONO 155-852-130 MG PO TABS
500.0000 mg | ORAL_TABLET | Freq: Four times a day (QID) | ORAL | Status: DC
  Filled 2014-04-18 (×4): qty 2

## 2014-04-18 MED ORDER — ASPIRIN 300 MG RE SUPP
300.0000 mg | Freq: Once | RECTAL | Status: AC
  Administered 2014-04-18: 300 mg via RECTAL

## 2014-04-18 MED ORDER — POTASSIUM PHOSPHATES 15 MMOLE/5ML IV SOLN
40.0000 meq | Freq: Once | INTRAVENOUS | Status: AC
  Administered 2014-04-18: 40 meq via INTRAVENOUS
  Filled 2014-04-18: qty 9.09

## 2014-04-18 MED ORDER — CHLORHEXIDINE GLUCONATE 0.12 % MT SOLN
15.0000 mL | Freq: Two times a day (BID) | OROMUCOSAL | Status: DC
  Administered 2014-04-18 – 2014-04-19 (×3): 15 mL via OROMUCOSAL
  Filled 2014-04-18 (×3): qty 15

## 2014-04-18 MED ORDER — INSULIN ASPART 100 UNIT/ML ~~LOC~~ SOLN
2.0000 [IU] | SUBCUTANEOUS | Status: DC
  Administered 2014-04-18 (×4): 4 [IU] via SUBCUTANEOUS
  Administered 2014-04-18: 2 [IU] via SUBCUTANEOUS
  Administered 2014-04-18: 6 [IU] via SUBCUTANEOUS
  Administered 2014-04-19 (×2): 2 [IU] via SUBCUTANEOUS

## 2014-04-18 MED ORDER — POTASSIUM CHLORIDE 20 MEQ/15ML (10%) PO LIQD
40.0000 meq | Freq: Once | ORAL | Status: AC
  Administered 2014-04-18: 40 meq via ORAL

## 2014-04-18 MED ORDER — ENOXAPARIN SODIUM 40 MG/0.4ML ~~LOC~~ SOLN: 40.0000 mg | SUBCUTANEOUS | Status: DC

## 2014-04-18 MED ORDER — POTASSIUM CHLORIDE 20 MEQ/15ML (10%) PO LIQD
ORAL | Status: AC
  Filled 2014-04-18: qty 15

## 2014-04-18 NOTE — Progress Notes (Signed)
Physician notified(Dr. Kendrick Fries) pt has not reached goal temperature yet. No new orders received. Will continue to monitor.

## 2014-04-18 NOTE — Progress Notes (Signed)
Pt was brought in as a CPR. Pt was revived, but was still unconscious. Pt was transported to Cath Lab. Chaplin provided emotional support, prayer and escorted family to Cath Lab waiting room.  Family did not need any other services from chaplin at this time.  04/18/14 0000  Clinical Encounter Type  Visited With Family  Visit Type Trauma  Referral From Nurse  Consult/Referral To Chaplain  Spiritual Encounters  Spiritual Needs Prayer;Grief support  Stress Factors  Patient Stress Factors None identified  Family Stress Factors None identified

## 2014-04-18 NOTE — Progress Notes (Signed)
Inpatient Diabetes Program Recommendations  AACE/ADA: New Consensus Statement on Inpatient Glycemic Control (2013)  Target Ranges:  Prepandial:   less than 140 mg/dL      Peak postprandial:   less than 180 mg/dL (1-2 hours)      Critically ill patients:  140 - 180 mg/dL   Pt ordered Hyperglycemia protocol  Pt still on phase 1, yet cbg's in 200's Please move to the phase 2 using the IV insulin  Thank you, Lenor Coffin, RN, CNS, Diabetes Coordinator [phone])

## 2014-04-18 NOTE — Progress Notes (Signed)
Pt had a run of v-tach. Pulses positive. Dr. Hiram Comber at bedside. Continue to monitor.

## 2014-04-18 NOTE — Progress Notes (Signed)
INITIAL NUTRITION ASSESSMENT  DOCUMENTATION CODES Per approved criteria  -Not Applicable   INTERVENTION: Once pt has re-warmed to 37 degrees C and if enteral nutrition is still warranted, recommend initiation of Vital AF 1.2 at 20 ml/hr, advance by 10 ml q 8 hours, to goal of 70 ml/hr. Goal regimen will provide: 2016 kcal, 126 g protein, 1362 ml free water. RD to continue to follow nutrition care plan.  NUTRITION DIAGNOSIS: Inadequate oral intake related to inability to eat as evidenced by NPO status.   Goal: Intake to meet >90% of estimated nutrition needs.  Monitor:  weight trends, lab trends, I/O's, vent status, TF initiation/tolerance, temperatures  Reason for Assessment: MD Consult for Initiation of Enteral Nutrition  68 y.o. male  Admitting Dx: VF cardiac arrest  ASSESSMENT: PMHx significant for HTN, HLD, temporal arteritis. Admitted s/p VF arrest.  Went to cardiac cath 6/16 - pt found to have acute anterior MI 2/2 total occlusion of the mid LAD.  Placed on hypothermia protocol. Current temperature is 32.7 degrees C, will not initiate enteral feeding until pt is re-warmed. RN notes that re-warming process will not begin until tomorrow morning.  Intubated 6/15. Patient is currently intubated on ventilator support. MV: 13 L/min Temp (24hrs), Avg:92.9 F (33.8 C), Min:90.9 F (32.7 C), Max:98.3 F (36.8 C)  Appears well-nourished.  CBG's: 190 - 206 Sodium WNL Potassium WNL --> ordered for KCl Phosphorus low at 1.0 --> ordered for Potassium Phosphate Magnesium WNL --> ordered for Magnesium Sulfate  Height: Ht Readings from Last 1 Encounters:  2014/02/24 6\' 1"  (1.854 m)    Weight: Wt Readings from Last 1 Encounters:  04/18/14 210 lb 1.9 oz (95.31 kg)    Ideal Body Weight: 184 lb/83.6 kg  % Ideal Body Weight: 114%  Wt Readings from Last 10 Encounters:  04/18/14 210 lb 1.9 oz (95.31 kg)  04/18/14 210 lb 1.9 oz (95.31 kg)    Usual Body Weight: n/a  %  Usual Body Weight: n/a  BMI:  Body mass index is 27.73 kg/(m^2). Overweight  Estimated Nutritional Needs (using 37 degrees C for temperature): Kcal: 2080 Protein: 115 - 130 g Fluid: approx 2 liters  Skin: intact   Diet Order:   NPO  EDUCATION NEEDS: -No education needs identified at this time   Intake/Output Summary (Last 24 hours) at 04/18/14 1335 Last data filed at 04/18/14 1300  Gross per 24 hour  Intake 1392.57 ml  Output   7200 ml  Net -5807.43 ml    Last BM: PTA  Labs:   Recent Labs Lab 2014/02/24 2254 04/18/14 0230  04/18/14 0634 04/18/14 0800 04/18/14 0813  NA 141 142  < > 136* 140  138 141  K 3.3* 3.5*  < > 5.1 4.1  4.0 4.0  CL 103 104  < > 111 104  102 103  CO2 14* 20  --   --  19  17*  --   BUN 14 13  < > 11 12  12 12   CREATININE 1.24 0.79  < > 0.50 0.67  0.67 0.70  CALCIUM 8.2* 7.8*  --   --  8.4  8.3*  --   MG  --  1.7  --   --  2.4  --   PHOS  --  1.4*  --   --  1.0*  --   GLUCOSE 253* 173*  < > 277* 234*  225* 230*  < > = values in this interval not displayed.  CBG (last  3)   Recent Labs  04/18/14 0810 04/18/14 0929 04/18/14 1051  GLUCAP 199* 206* 190*    Scheduled Meds: . antiseptic oral rinse  15 mL Mouth Rinse QID  . artificial tears  1 application Both Eyes 3 times per day  . [START ON 04/19/2014] aspirin  81 mg Per Tube Daily  . atorvastatin  80 mg Per Tube q1800  . chlorhexidine  15 mL Mouth Rinse BID  . fentaNYL  100 mcg Intravenous Once  . heparin subcutaneous  5,000 Units Subcutaneous 3 times per day  . hydrocortisone sod succinate (SOLU-CORTEF) inj  50 mg Intravenous Q6H  . insulin aspart  2-6 Units Subcutaneous 6 times per day  . midazolam  2 mg Intravenous Once  . pantoprazole (PROTONIX) IV  40 mg Intravenous Q24H  . potassium phosphate IVPB (mEq)  40 mEq Intravenous Once  . prasugrel  10 mg Oral Daily    Continuous Infusions: . cisatracurium (NIMBEX) infusion 1 mcg/kg/min (04/18/14 0800)  . fentaNYL infusion  INTRAVENOUS 100 mcg/hr (04/18/14 0800)  . midazolam (VERSED) infusion 2 mg/hr (04/18/14 0800)  . norepinephrine (LEVOPHED) Adult infusion    . tirofiban 0.15 mcg/kg/min (04/18/14 1155)    Past Medical History  Diagnosis Date  . HTN (hypertension)   . HLD (hyperlipidemia)   . Temporal arteritis     Past Surgical History  Procedure Laterality Date  . No past surgeries      Andre Motto MS, RD, LDN Inpatient Registered Dietitian Pager: [phone] After-hours pager: [phone]

## 2014-04-18 NOTE — ED Provider Notes (Signed)
CSN: [id number]     Arrival date & time May 01, 2014  2237 History   First MD Initiated Contact with Patient 05-01-14 2248     Chief Complaint  Patient presents with  . Cardiac Arrest     (Consider location/radiation/quality/duration/timing/severity/associated sxs/prior Treatment) Patient is a 68 y.o. male presenting with general illness.  Illness Location:  Heart Quality:  Stopped Severity:  Severe Onset quality:  Sudden Duration: approx 15 minutes. Timing:  Constant Progression:  Resolved Chronicity:  New Context:  Showering Relieved by:  Cpr, defibrillations   Past Medical History  Diagnosis Date  . HTN (hypertension)   . HLD (hyperlipidemia)   . Temporal arteritis    Past Surgical History  Procedure Laterality Date  . No past surgeries     Family History  Problem Relation Age of Onset  . Heart attack Father 30  . Coronary artery disease Mother     5 stents   History  Substance Use Topics  . Smoking status: Never Smoker   . Smokeless tobacco: Not on file  . Alcohol Use: No    Review of Systems  Unable to perform ROS: Intubated      Allergies  Review of patient's allergies indicates no known allergies.  Home Medications   Prior to Admission medications   Medication Sig Start Date End Date Taking? Authorizing Provider  albuterol (PROVENTIL HFA;VENTOLIN HFA) 108 (90 BASE) MCG/ACT inhaler Inhale 1 puff into the lungs every 6 (six) hours as needed for wheezing or shortness of breath.   Yes Historical Provider, MD  aspirin 81 MG tablet Take 81 mg by mouth daily.   Yes Historical Provider, MD  atorvastatin (LIPITOR) 10 MG tablet Take 10 mg by mouth daily.   Yes Historical Provider, MD  Cyanocobalamin (VITAMIN B-12 PO) Take 1 tablet by mouth daily.   Yes Historical Provider, MD  Eszopiclone 3 MG TABS Take 3 mg by mouth at bedtime as needed (sleep). Take immediately before bedtime   Yes Historical Provider, MD  folic acid (FOLVITE) 1 MG tablet Take 1 mg by mouth  daily.   Yes Historical Provider, MD  irbesartan (AVAPRO) 150 MG tablet Take 150 mg by mouth daily.   Yes Historical Provider, MD  leucovorin (WELLCOVORIN) 10 MG tablet Take 10 mg by mouth once a week. Take on saturdays (one day after methotrexate)   Yes Historical Provider, MD  Methotrexate, Anti-Rheumatic, (METHOTREXATE, PF, [place]) Inject 0.8 mg into the skin once a week. Every Friday   Yes Historical Provider, MD  Multiple Vitamins-Minerals (MULTIVITAMIN WITH MINERALS) tablet Take 1 tablet by mouth daily.   Yes Historical Provider, MD  predniSONE (DELTASONE) 1 MG tablet Take 8 mg by mouth daily. Taper 8 mg daily for 1 week then 7 mg daily for 3 weeks 2014-05-01  Yes Historical Provider, MD  risedronate (ACTONEL) 150 MG tablet Take 150 mg by mouth every 30 (thirty) days. with water on empty stomach, nothing by mouth or lie down for next 30 minutes.   Yes Historical Provider, MD  sertraline (ZOLOFT) 100 MG tablet Take 100 mg by mouth daily.   Yes Historical Provider, MD  triamcinolone (NASACORT) 55 MCG/ACT AERO nasal inhaler Place 1 spray into the nose daily as needed (allergies).   Yes Historical Provider, MD  valACYclovir (VALTREX) 500 MG tablet Take 500 mg by mouth daily as needed (outbreaks).   Yes Historical Provider, MD   BP 147/65  Pulse 42  Temp(Src) 92.5 F (33.6 C) (Core (Comment))  Resp 0  Ht 6\' 1"  (1.854 m)  Wt 210 lb 1.9 oz (95.31 kg)  BMI 27.73 kg/m2  SpO2 97% Physical Exam  Nursing note and vitals reviewed. Constitutional: He appears well-developed and well-nourished. He is intubated.  HENT:  Head: Normocephalic and atraumatic.  Eyes: Conjunctivae and EOM are normal.  Pupils unequal but both responsive  Neck: Neck supple. No thyromegaly present.  Cardiovascular: Normal rate and regular rhythm.   Pulmonary/Chest: He is intubated.  Intubated, but with spontaneous respirations  Abdominal: Soft. He exhibits no distension.  Musculoskeletal: He exhibits no edema.  Neurological:  GCS eye subscore is 1. GCS verbal subscore is 1. GCS motor subscore is 1.  GCS 3  Skin: No rash noted. No erythema.    ED Course  Procedures (including critical care time) Labs Review Labs Reviewed  CBC - Abnormal; Notable for the following:    WBC 15.3 (*)    RBC 3.71 (*)    Hemoglobin 12.0 (*)    HCT 36.7 (*)    All other components within normal limits  BASIC METABOLIC PANEL - Abnormal; Notable for the following:    Potassium 3.3 (*)    CO2 14 (*)    Glucose, Bld 253 (*)    Calcium 8.2 (*)    GFR calc non Af Amer 58 (*)    GFR calc Af Amer 68 (*)    All other components within normal limits  BASIC METABOLIC PANEL - Abnormal; Notable for the following:    Potassium 3.5 (*)    Glucose, Bld 173 (*)    Calcium 7.8 (*)    All other components within normal limits  BASIC METABOLIC PANEL - Abnormal; Notable for the following:    Glucose, Bld 234 (*)    All other components within normal limits  BASIC METABOLIC PANEL - Abnormal; Notable for the following:    CO2 17 (*)    Glucose, Bld 225 (*)    Calcium 8.3 (*)    All other components within normal limits  TROPONIN I - Abnormal; Notable for the following:    Troponin I >20.00 (*)    All other components within normal limits  TROPONIN I - Abnormal; Notable for the following:    Troponin I >20.00 (*)    All other components within normal limits  LACTIC ACID, PLASMA - Abnormal; Notable for the following:    Lactic Acid, Venous 2.6 (*)    All other components within normal limits  PHOSPHORUS - Abnormal; Notable for the following:    Phosphorus 1.4 (*)    All other components within normal limits  TROPONIN I - Abnormal; Notable for the following:    Troponin I >20.00 (*)    All other components within normal limits  PRO B NATRIURETIC PEPTIDE - Abnormal; Notable for the following:    Pro B Natriuretic peptide (BNP) 737.1 (*)    All other components within normal limits  CBC - Abnormal; Notable for the following:    WBC  21.3 (*)    All other components within normal limits  GLUCOSE, CAPILLARY - Abnormal; Notable for the following:    Glucose-Capillary 140 (*)    All other components within normal limits  GLUCOSE, CAPILLARY - Abnormal; Notable for the following:    Glucose-Capillary 166 (*)    All other components within normal limits  GLUCOSE, CAPILLARY - Abnormal; Notable for the following:    Glucose-Capillary 189 (*)    All other components within normal limits  GLUCOSE, CAPILLARY -  Abnormal; Notable for the following:    Glucose-Capillary 209 (*)    All other components within normal limits  GLUCOSE, CAPILLARY - Abnormal; Notable for the following:    Glucose-Capillary 225 (*)    All other components within normal limits  GLUCOSE, CAPILLARY - Abnormal; Notable for the following:    Glucose-Capillary 210 (*)    All other components within normal limits  PHOSPHORUS - Abnormal; Notable for the following:    Phosphorus 1.0 (*)    All other components within normal limits  GLUCOSE, CAPILLARY - Abnormal; Notable for the following:    Glucose-Capillary 199 (*)    All other components within normal limits  GLUCOSE, CAPILLARY - Abnormal; Notable for the following:    Glucose-Capillary 206 (*)    All other components within normal limits  GLUCOSE, CAPILLARY - Abnormal; Notable for the following:    Glucose-Capillary 190 (*)    All other components within normal limits  CBG MONITORING, ED - Abnormal; Notable for the following:    Glucose-Capillary 220 (*)    All other components within normal limits  POCT I-STAT 3, ART BLOOD GAS (G3+) - Abnormal; Notable for the following:    pCO2 arterial 24.8 (*)    pO2, Arterial 58.0 (*)    Bicarbonate 17.1 (*)    Acid-base deficit 7.0 (*)    All other components within normal limits  POCT I-STAT, CHEM 8 - Abnormal; Notable for the following:    Potassium 3.3 (*)    Chloride 114 (*)    Glucose, Bld 230 (*)    All other components within normal limits  POCT  I-STAT, CHEM 8 - Abnormal; Notable for the following:    Sodium 136 (*)    Glucose, Bld 277 (*)    Calcium, Ion 1.09 (*)    All other components within normal limits  POCT I-STAT, CHEM 8 - Abnormal; Notable for the following:    Glucose, Bld 230 (*)    Calcium, Ion 1.12 (*)    All other components within normal limits  POCT I-STAT 3, ART BLOOD GAS (G3+) - Abnormal; Notable for the following:    pCO2 arterial 23.1 (*)    pO2, Arterial 47.0 (*)    Bicarbonate 16.3 (*)    Acid-base deficit 7.0 (*)    All other components within normal limits  MRSA PCR SCREENING  APTT  PROTIME-INR  APTT  MAGNESIUM  LACTIC ACID, PLASMA  PLATELET COUNT  MAGNESIUM  BASIC METABOLIC PANEL  BASIC METABOLIC PANEL  BASIC METABOLIC PANEL  BASIC METABOLIC PANEL  BASIC METABOLIC PANEL  BASIC METABOLIC PANEL  BASIC METABOLIC PANEL  BLOOD GAS, ARTERIAL  BASIC METABOLIC PANEL  BASIC METABOLIC PANEL    Imaging Review Dg Chest Port 1 View  04/18/2014   CLINICAL DATA:  Central line placement.  EXAM: PORTABLE CHEST - 1 VIEW  COMPARISON:  04/03/2014, 2304 hr.  FINDINGS: Right IJ central line is present with the tip in the mid SVC. Pulmonary aeration has worsened with decreasing volumes and increasing perihilar pulmonary edema. Interstitial pulmonary edema has progressed alveolar edema at the bases. Endotracheal tube and nasogastric tube unchanged defibrillator pads continued overlie the chest.  IMPRESSION: 1. Uncomplicated right IJ central line placement. 2. Other support apparatus stable. 3. Worsening pulmonary edema/CHF.   Electronically Signed   By: Andreas Newport M.D.   On: 04/18/2014 02:09   Dg Chest Port 1 View  04/18/2014   CLINICAL DATA:  Endotracheal tube placement.  CPR.  EXAM:  PORTABLE CHEST - 1 VIEW  COMPARISON:  None.  FINDINGS: Defibrillator pads overlie the chest. Endotracheal tube tip appears 4 cm from the carina. Enteric tube is present with the tip not visualized. Lung volumes are low. There is  diffuse interstitial prominence. This is suggestive of interstitial pulmonary edema. The cardiopericardial silhouette appears enlarged.  IMPRESSION: 1. Endotracheal tube tip 4 cm from the carina. Enteric tube tip not visualized 2. Low volume chest with interstitial pulmonary edema and cardiomegaly.   Electronically Signed   By: Andreas Newport M.D.   On: 04/18/2014 00:35     EKG Interpretation None      MDM   Final diagnoses:  Acute respiratory failure with hypoxia  Cardiac arrest  STEMI (ST elevation myocardial infarction)    68 yo M brought in status post CPR. Wife heard him fall in the shower, went in and found him lifeless and pulseless, did two rounds of cpr and called EMS. On their arrival the patient was in vfib, shocked 3 times and had ROSC. On the way in patient went into vtach without a pulse, given amiodarone adn shocked him again and again had ROSC. On arrival to ED patient being paced. Pacing shut off and he had strong radial pulses, ETT in place, breathing spontaneously but GCS 3. ECG with anterior/lateral STE. Code STEMI initiated, critical care consulted, patient taken to cath lab.     Marily Memos, MD 04/18/14 [phone]

## 2014-04-18 NOTE — Progress Notes (Signed)
eLink Physician-Brief Progress Note Patient Name: Thaddues Petrilli DOB: 07/13/1946 MRN: [id number]  Date of Service  04/18/2014   HPI/Events of Note   CXR reviewed > CVL OK Hypertension post STEMI  eICU Interventions  Add metoprolol 5mg  IV q6h   Intervention Category Intermediate Interventions: Hypertension - evaluation and management Minor Interventions: Routine modifications to care plan (e.g. PRN medications for pain, fever)  MCQUAID, DOUGLAS 04/18/2014, 2:22 AM

## 2014-04-18 NOTE — Progress Notes (Signed)
Subjective:  Intubated, sedated and cooled  Objective:  Temp:  [91 F (32.8 C)-98.3 F (36.8 C)] 91.8 F (33.2 C) (06/16 0800) Pulse Rate:  [25-81] 40 (06/16 0838) Resp:  [15-27] 24 (06/16 0838) BP: (120-192)/(73-112) 192/78 mmHg (06/16 0838) SpO2:  [95 %-100 %] 100 % (06/16 0838) Arterial Line BP: (131-168)/(82-101) 141/84 mmHg (06/16 0800) FiO2 (%):  [40 %-100 %] 40 % (06/16 0838) Weight:  [210 lb 1.9 oz (95.31 kg)-220 lb (99.791 kg)] 210 lb 1.9 oz (95.31 kg) (06/16 0100) Weight change:   Intake/Output from previous day: 06/15 0701 - 06/16 0700 In: 693.4 [I.V.:218.4; NG/GT:80; IV Piggyback:395] Out: 6025 [Urine:6025]  Intake/Output from this shift: Total I/O In: 35.2 [I.V.:35.2] Out: 600 [Urine:600]  Physical Exam: General appearance: Sedated and paralyzed Neck: no adenopathy, no carotid bruit, no JVD, supple, symmetrical, trachea midline and thyroid not enlarged, symmetric, no tenderness/mass/nodules Lungs: clear to auscultation bilaterally Heart: regular rate and rhythm, S1, S2 normal, no murmur, click, rub or gallop Extremities: extremities normal, atraumatic, no cyanosis or edema  Lab Results: Results for orders placed during the hospital encounter of 04/10/2014 (from the past 48 hour(s))  CBG MONITORING, ED     Status: Abnormal   Collection Time    04/04/2014 10:43 PM      Result Value Ref Range   Glucose-Capillary 220 (*) 70 - 99 mg/dL  CBC     Status: Abnormal   Collection Time    04/06/2014 10:54 PM      Result Value Ref Range   WBC 15.3 (*) 4.0 - 10.5 K/uL   RBC 3.71 (*) 4.22 - 5.81 MIL/uL   Hemoglobin 12.0 (*) 13.0 - 17.0 g/dL   HCT 36.7 (*) 39.0 - 52.0 %   MCV 98.9  78.0 - 100.0 fL   MCH 32.3  26.0 - 34.0 pg   MCHC 32.7  30.0 - 36.0 g/dL   RDW 14.2  11.5 - 15.5 %   Platelets 338  150 - 400 K/uL  APTT     Status: None   Collection Time    04/08/2014 10:54 PM      Result Value Ref Range   aPTT 34  24 - 37 seconds  BASIC METABOLIC PANEL      Status: Abnormal   Collection Time    04/04/2014 10:54 PM      Result Value Ref Range   Sodium 141  137 - 147 mEq/L   Potassium 3.3 (*) 3.7 - 5.3 mEq/L   Chloride 103  96 - 112 mEq/L   CO2 14 (*) 19 - 32 mEq/L   Glucose, Bld 253 (*) 70 - 99 mg/dL   BUN 14  6 - 23 mg/dL   Creatinine, Ser 1.24  0.50 - 1.35 mg/dL   Calcium 8.2 (*) 8.4 - 10.5 mg/dL   GFR calc non Af Amer 58 (*) >90 mL/min   GFR calc Af Amer 68 (*) >90 mL/min   Comment: (NOTE)     The eGFR has been calculated using the CKD EPI equation.     This calculation has not been validated in all clinical situations.     eGFR's persistently <90 mL/min signify possible Chronic Kidney     Disease.  PROTIME-INR     Status: None   Collection Time    04/28/2014 10:54 PM      Result Value Ref Range   Prothrombin Time 14.3  11.6 - 15.2 seconds   INR 1.13  0.00 - 1.49  MRSA PCR SCREENING     Status: None   Collection Time    04/18/14  1:06 AM      Result Value Ref Range   MRSA by PCR NEGATIVE  NEGATIVE   Comment:            The GeneXpert MRSA Assay (FDA     approved for NASAL specimens     only), is one component of a     comprehensive MRSA colonization     surveillance program. It is not     intended to diagnose MRSA     infection nor to guide or     monitor treatment for     MRSA infections.  GLUCOSE, CAPILLARY     Status: Abnormal   Collection Time    04/18/14  1:17 AM      Result Value Ref Range   Glucose-Capillary 140 (*) 70 - 99 mg/dL  GLUCOSE, CAPILLARY     Status: Abnormal   Collection Time    04/18/14  2:24 AM      Result Value Ref Range   Glucose-Capillary 166 (*) 70 - 99 mg/dL  BASIC METABOLIC PANEL     Status: Abnormal   Collection Time    04/18/14  2:30 AM      Result Value Ref Range   Sodium 142  137 - 147 mEq/L   Potassium 3.5 (*) 3.7 - 5.3 mEq/L   Chloride 104  96 - 112 mEq/L   CO2 20  19 - 32 mEq/L   Glucose, Bld 173 (*) 70 - 99 mg/dL   BUN 13  6 - 23 mg/dL   Creatinine, Ser 0.79  0.50 - 1.35 mg/dL     Comment: DELTA CHECK NOTED   Calcium 7.8 (*) 8.4 - 10.5 mg/dL   GFR calc non Af Amer >90  >90 mL/min   GFR calc Af Amer >90  >90 mL/min   Comment: (NOTE)     The eGFR has been calculated using the CKD EPI equation.     This calculation has not been validated in all clinical situations.     eGFR's persistently <90 mL/min signify possible Chronic Kidney     Disease.  MAGNESIUM     Status: None   Collection Time    04/18/14  2:30 AM      Result Value Ref Range   Magnesium 1.7  1.5 - 2.5 mg/dL  PHOSPHORUS     Status: Abnormal   Collection Time    04/18/14  2:30 AM      Result Value Ref Range   Phosphorus 1.4 (*) 2.3 - 4.6 mg/dL  TROPONIN I     Status: Abnormal   Collection Time    04/18/14  2:30 AM      Result Value Ref Range   Troponin I >20.00 (*) <0.30 ng/mL   Comment:            Due to the release kinetics of cTnI,     a negative result within the first hours     of the onset of symptoms does not rule out     myocardial infarction with certainty.     If myocardial infarction is still suspected,     repeat the test at appropriate intervals.     CRITICAL RESULT CALLED TO, READ BACK BY AND VERIFIED WITHDuanne Limerick, [place] [id number] [facility]  LACTIC ACID, PLASMA     Status: None   Collection Time  04/18/14  2:30 AM      Result Value Ref Range   Lactic Acid, Venous 1.6  0.5 - 2.2 mmol/L  CBC     Status: Abnormal   Collection Time    04/18/14  2:30 AM      Result Value Ref Range   WBC 21.3 (*) 4.0 - 10.5 K/uL   RBC 4.25  4.22 - 5.81 MIL/uL   Hemoglobin 14.0  13.0 - 17.0 g/dL   HCT 41.3  39.0 - 52.0 %   MCV 97.2  78.0 - 100.0 fL   MCH 32.9  26.0 - 34.0 pg   MCHC 33.9  30.0 - 36.0 g/dL   RDW 14.3  11.5 - 15.5 %   Platelets 344  150 - 400 K/uL  GLUCOSE, CAPILLARY     Status: Abnormal   Collection Time    04/18/14  3:40 AM      Result Value Ref Range   Glucose-Capillary 189 (*) 70 - 99 mg/dL  POCT I-STAT 3, ART BLOOD GAS (G3+)     Status: Abnormal   Collection Time     04/18/14  4:17 AM      Result Value Ref Range   pH, Arterial 7.431  7.350 - 7.450   pCO2 arterial 24.8 (*) 35.0 - 45.0 mmHg   pO2, Arterial 58.0 (*) 80.0 - 100.0 mmHg   Bicarbonate 17.1 (*) 20.0 - 24.0 mEq/L   TCO2 18  0 - 100 mmol/L   O2 Saturation 95.0     Acid-base deficit 7.0 (*) 0.0 - 2.0 mmol/L   Patient temperature 33.4 C     Collection site RADIAL, ALLEN'S TEST ACCEPTABLE     Drawn by RT     Sample type ARTERIAL    GLUCOSE, CAPILLARY     Status: Abnormal   Collection Time    04/18/14  4:39 AM      Result Value Ref Range   Glucose-Capillary 209 (*) 70 - 99 mg/dL  POCT I-STAT, CHEM 8     Status: Abnormal   Collection Time    04/18/14  4:44 AM      Result Value Ref Range   Sodium 137  137 - 147 mEq/L   Potassium 3.3 (*) 3.7 - 5.3 mEq/L   Chloride 114 (*) 96 - 112 mEq/L   BUN 11  6 - 23 mg/dL   Creatinine, Ser 0.70  0.50 - 1.35 mg/dL   Glucose, Bld 230 (*) 70 - 99 mg/dL   Calcium, Ion 1.15  1.13 - 1.30 mmol/L   TCO2 17  0 - 100 mmol/L   Hemoglobin 16.0  13.0 - 17.0 g/dL   HCT 47.0  39.0 - 52.0 %  GLUCOSE, CAPILLARY     Status: Abnormal   Collection Time    04/18/14  5:25 AM      Result Value Ref Range   Glucose-Capillary 225 (*) 70 - 99 mg/dL  POCT I-STAT, CHEM 8     Status: Abnormal   Collection Time    04/18/14  6:34 AM      Result Value Ref Range   Sodium 136 (*) 137 - 147 mEq/L   Potassium 5.1  3.7 - 5.3 mEq/L   Chloride 111  96 - 112 mEq/L   BUN 11  6 - 23 mg/dL   Creatinine, Ser 0.50  0.50 - 1.35 mg/dL   Glucose, Bld 277 (*) 70 - 99 mg/dL   Calcium, Ion 1.09 (*) 1.13 - 1.30 mmol/L  TCO2 15  0 - 100 mmol/L   Hemoglobin 16.0  13.0 - 17.0 g/dL   HCT 47.0  39.0 - 52.0 %  GLUCOSE, CAPILLARY     Status: Abnormal   Collection Time    04/18/14  7:01 AM      Result Value Ref Range   Glucose-Capillary 210 (*) 70 - 99 mg/dL  BASIC METABOLIC PANEL     Status: Abnormal   Collection Time    04/18/14  8:00 AM      Result Value Ref Range   Sodium 140  137 -  147 mEq/L   Potassium 4.1  3.7 - 5.3 mEq/L   Chloride 104  96 - 112 mEq/L   CO2 19  19 - 32 mEq/L   Glucose, Bld 234 (*) 70 - 99 mg/dL   BUN 12  6 - 23 mg/dL   Creatinine, Ser 0.67  0.50 - 1.35 mg/dL   Calcium 8.4  8.4 - 10.5 mg/dL   GFR calc non Af Amer >90  >90 mL/min   GFR calc Af Amer >90  >90 mL/min   Comment: (NOTE)     The eGFR has been calculated using the CKD EPI equation.     This calculation has not been validated in all clinical situations.     eGFR's persistently <90 mL/min signify possible Chronic Kidney     Disease.  APTT     Status: None   Collection Time    04/18/14  8:00 AM      Result Value Ref Range   aPTT 37  24 - 37 seconds   Comment:            IF BASELINE aPTT IS ELEVATED,     SUGGEST PATIENT RISK ASSESSMENT     BE USED TO DETERMINE APPROPRIATE     ANTICOAGULANT THERAPY.  TROPONIN I     Status: Abnormal   Collection Time    04/18/14  8:00 AM      Result Value Ref Range   Troponin I >20.00 (*) <0.30 ng/mL   Comment:            Due to the release kinetics of cTnI,     a negative result within the first hours     of the onset of symptoms does not rule out     myocardial infarction with certainty.     If myocardial infarction is still suspected,     repeat the test at appropriate intervals.     CRITICAL VALUE NOTED.  VALUE IS CONSISTENT WITH PREVIOUSLY REPORTED AND CALLED VALUE.  LACTIC ACID, PLASMA     Status: Abnormal   Collection Time    04/18/14  8:00 AM      Result Value Ref Range   Lactic Acid, Venous 2.6 (*) 0.5 - 2.2 mmol/L  PRO B NATRIURETIC PEPTIDE     Status: Abnormal   Collection Time    04/18/14  8:00 AM      Result Value Ref Range   Pro B Natriuretic peptide (BNP) 737.1 (*) 0 - 125 pg/mL  PLATELET COUNT     Status: None   Collection Time    04/18/14  8:00 AM      Result Value Ref Range   Platelets 373  150 - 400 K/uL  POCT I-STAT, CHEM 8     Status: Abnormal   Collection Time    04/18/14  8:13 AM      Result Value Ref Range  Sodium 141  137 - 147 mEq/L   Potassium 4.0  3.7 - 5.3 mEq/L   Chloride 103  96 - 112 mEq/L   BUN 12  6 - 23 mg/dL   Creatinine, Ser 0.70  0.50 - 1.35 mg/dL   Glucose, Bld 230 (*) 70 - 99 mg/dL   Calcium, Ion 1.12 (*) 1.13 - 1.30 mmol/L   TCO2 23  0 - 100 mmol/L   Hemoglobin 16.7  13.0 - 17.0 g/dL   HCT 49.0  39.0 - 52.0 %    Imaging: Imaging results have been reviewed  Assessment/Plan:   1. Active Problems: 2.   Cardiac arrest 3.   Acute respiratory failure with hypoxia 4.   Time Spent Directly with Patient:  20 minutes  Length of Stay:  LOS: 1 day   POD #0 VF arrest, Ant STEMI, Urgent radial PCI by Dr. Burt Knack (radial) with DES prox/mid LAD. EF 30%. No other signif CAD. Hemodynamically stable. Bradycardic with PVCs last PM. Getting K and Mg repleted. Recheck BMET and Mg, Phos  levels. Good UOP. On DAPT (Effient). On Aggrastat (18 hrs). PCCM managing Vent, cooling, other IV meds per [facility] protocol.   Lorretta Harp 04/18/2014, 9:16 AM

## 2014-04-18 NOTE — Progress Notes (Addendum)
PULMONARY / CRITICAL CARE MEDICINE   Name: Andre Moran MRN: [id number] DOB: 1946/05/27    ADMISSION DATE:  04/15/2014 CONSULTATION DATE:  04/14/2014  REFERRING MD :  EDP PRIMARY SERVICE: PCCM  CHIEF COMPLAINT:  VF cardiac arrest  BRIEF PATIENT DESCRIPTION: 68 y.o. M who suffered v.fib arrest at home with ROSC in 15 minutes.  Brought to ED, GCS 3 and found to have STEMI.  LHC and PTCA to LAD  SIGNIFICANT EVENTS / STUDIES:  6/15 Admitted post VF arrest. Hypothermia protocol initiated  6/15 LHC: angioplasty and DES to mid LAD. LVEF 30%  LINES / TUBES: ETT 6/15 >>  R IJ CVL  6/16  >>  L radial A-line 6/16 >>   CULTURES:   ANTIBIOTICS:    SUBJECTIVE:  Intubated, cooled, paralyzed  VITAL SIGNS: Temp:  [90.9 F (32.7 C)-98.3 F (36.8 C)] 91 F (32.8 C) (06/16 1900) Pulse Rate:  [25-81] 42 (06/16 1134) Resp:  [9-27] 24 (06/16 1900) BP: (120-192)/(65-112) 142/80 mmHg (06/16 1800) SpO2:  [95 %-100 %] 95 % (06/16 1900) Arterial Line BP: (131-183)/(72-105) 142/75 mmHg (06/16 1900) FiO2 (%):  [40 %-100 %] 60 % (06/16 1900) Weight:  [95.31 kg (210 lb 1.9 oz)-99.791 kg (220 lb)] 95.31 kg (210 lb 1.9 oz) (06/16 0100) HEMODYNAMICS: CVP:  [4 mmHg-13 mmHg] 4 mmHg VENTILATOR SETTINGS: Vent Mode:  [-] PRVC FiO2 (%):  [40 %-100 %] 60 % Set Rate:  [18 bmp-24 bmp] 24 bmp Vt Set:  [550 mL] 550 mL PEEP:  [5 cmH20] 5 cmH20 Plateau Pressure:  [18 cmH20-22 cmH20] 18 cmH20 INTAKE / OUTPUT: Intake/Output     06/16 0701 - 06/17 0700   I.V. (mL/kg) 482.8 (5.1)   NG/GT    IV Piggyback 500   Total Intake(mL/kg) 982.8 (10.3)   Urine (mL/kg/hr) 1570 (1.2)   Total Output 1570   Net -587.3         PHYSICAL EXAMINATION: General: sedated, paralyzed Neuro: unable to perform due to NMB HEENT: [place]/AT. PERRL Cardiovascular: brady, no M Lungs: Clear Abdomen: soft, +BS Ext: cool, no edema   LABS: I have reviewed all of today's lab results. 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 Bradycardia Ventricular ectopy P:  Complete hypothermia protocol Cards managing Cont current Rx inc ASA, statin, anticoagulation  RENAL A:   No issues P:   Monitor BMET intermittently Monitor I/Os Correct electrolytes as indicated  GASTROINTESTINAL A:   No issues P:   SUP: IV PPI Consider TFs 6/17  HEMATOLOGIC A:  No issues P:  DVT px: full anticoagulation Monitor CBC intermittently Transfuse per usual ICU guidelines  INFECTIOUS A:   Immunosuppressed (chronic steroids, MTX for temporal arteritis) P:   Monitor WBC, temp Micro and abx as above  ENDOCRINE A:   Hyperglycemia due to stress, steroids Chronic steroids (temporal arteritis) P:   Cont SSI Stress dose steroids  NEUROLOGIC A:   Post anoxic encephalopathy P:   Cont sedation/analgesia per hypothermia protocol  Wife updated in detail @ bedside 45 mins CCM time  Billy Fischer, MD ; [facility] service Mobile [phone].  After 5:30 PM or weekends, call [phone]  04/18/2014, 8:23 PM

## 2014-04-18 NOTE — Procedures (Signed)
Arterial Catheter Insertion Procedure Note Andre Moran [id number] May 04, 1946  Procedure: Insertion of Arterial Catheter  Indications: Blood pressure monitoring and Frequent blood sampling  Procedure Details Consent: Risks of procedure as well as the alternatives and risks of each were explained to the (patient/caregiver).  Consent for procedure obtained. and Unable to obtain consent because of emergent medical necessity. Time Out: Verified patient identification, verified procedure, site/side was marked, verified correct patient position, special equipment/implants available, medications/allergies/relevent history reviewed, required imaging and test results available.  Performed  Maximum sterile technique was used including antiseptics, cap, gloves, gown, hand hygiene, mask and sheet. Skin prep: Chlorhexidine; local anesthetic administered 22 gauge catheter was inserted into left radial artery using the Seldinger technique.  Evaluation Blood flow good; BP tracing good. Complications: No apparent complications.   Andre Moran 04/18/2014

## 2014-04-18 NOTE — Procedures (Signed)
Staffed procedure earlier 04/18/2014  Dr. Kalman Shan, M.D., [facility].C.P Pulmonary and Critical Care Medicine Staff Physician [facility] [place] [facility] Pager: [phone], If no answer or between  15:00h - 7:00h: call [phone]  04/18/2014 3:43 PM

## 2014-04-18 NOTE — Progress Notes (Signed)
04/18/14 0009  Adult Ventilator Settings  Vent Type Servo i  Humidity HME  Vent Mode PRVC  Vt Set 550 mL  Set Rate 24 bmp  FiO2 (%) 100 %  I Time 0.8 Sec(s)  PEEP 5 cmH20  Per MD RR increased to 24 post ABG done in the Cath Lab. ABG results: 7.10/53/160/16.

## 2014-04-18 NOTE — Progress Notes (Signed)
CRITICAL VALUE ALERT  Critical value received:  Phosphorus 1.0  Date of notification:  04/18/14  Time of notification:  09:56  Critical value read back:yes  Nurse who received alert:  [place] Craven. RN  MD notified (1st page):  Dr. Sung Amabile, MD  Time of first page:  10:05  MD notified (2nd page):  Time of second page:  Responding MD:  Dr. Sung Amabile, MD  Time MD responded: 11:15

## 2014-04-18 NOTE — CV Procedure (Signed)
Cardiac Catheterization Procedure Note  Name: Andre Moran MRN: [id number] DOB: 05-19-1946  Procedure: Left Heart Cath, Selective Coronary Angiography, LV angiography, PTCA and stenting of the mid LAD  Indication: 68 year old gentleman with no past cardiac history. He has a history of temporal arteritis on chronic steroids, hypertension, and hyperlipidemia. The patient did not feel well most of the day today. He experienced jaw discomfort. Tonight he had a witnessed out of hospital arrest. His wife performed CPR and called EMS. The patient had ventricular fibrillation on initial rhythm assessment and he was defibrillated. After 10-15 minutes he had return of spontaneous circulation. His post resuscitation EKG showed anterior ST segment elevation and a code STEMI was called.  Procedural Details:  The right wrist was prepped, draped, and anesthetized with 1% lidocaine. Using the modified Seldinger technique, a 5/6 French sheath was introduced into the right radial artery. 3 mg of verapamil was administered through the sheath, weight-based unfractionated heparin was administered intravenously. Standard Judkins catheters were used for selective coronary angiography and left ventriculography. Ventriculography was performed at the completion of the PCI procedure. Catheter exchanges were performed over an exchange length guidewire.  PROCEDURAL FINDINGS Hemodynamics: AO 110/70 LV 109/28   Coronary angiography: Coronary dominance: right  Left mainstem: The left main is patent. It arises from the left cusp, has diffuse irregularity but no significant stenosis, and divides into the LAD and left circumflex.  Left anterior descending (LAD): The LAD has minor nonobstructive plaque proximally, the first diagonal is patent, and after the first diagonal the LAD is totally occluded with contrast stain suggestive of acute occlusion.  Left circumflex (LCx): The left circumflex is very large in caliber. The  proximal vessel is widely patent. There are 2 major OM branches. The first OM has minimal nonobstructive disease. The second OM is widely patent and smooth throughout. The AV circumflex between the first and second OM branches has minor diffuse narrowing of about 20-30%.  Right coronary artery (RCA): Large, dominant vessel. There are diffuse irregularities but no significant stenoses noted. The PDA branch is large in caliber without significant disease. There are 3 posterior lateral branches, with PL 3 being the largest. No stenoses are noted throughout the posterolateral branches.  Left ventriculography: The mid and distal anterolateral wall are akinetic. The apex and inferoapical are akinetic. The basal and midinferior walls in the basal anterolateral wall contract normally. The estimated LVEF is 30%.  PCI Note:  Following the diagnostic procedure, the decision was made to proceed with PCI. Heparin and Aggrastat were used for anticoagulation. Once a therapeutic ACT was achieved, a 6 JamaicaFrench XB LAD 3.5 cm guide catheter was inserted.  A cougar coronary guidewire was used to cross the lesion.  The lesion was predilated with a 2.5 x 15 mm balloon.  The lesion was very long with diffuse ulcerated appearing stenosis. The lesion was then stented with a 3.0 x 38 mm Xience Alpine drug-eluting stent.  At the proximal edge of the stent, there was residual disease with hypodensity noted. A second 3.0 x 12 mm Xience stent was deployed in overlapping fashion at 14 atmospheres. The stented segment was postdilated with a 3.5 mm noncompliant balloon.  Following PCI, there was 0% residual stenosis and TIMI-3 flow. Final angiography confirmed an excellent result. The patient tolerated the procedure well. There were no immediate procedural complications. A TR band was used for radial hemostasis. The patient was transferred to the post catheterization recovery area for further monitoring.  PCI Data: Vessel -  LAD/Segment -  mid Percent Stenosis (pre)  100 TIMI-flow 0 Stent 3.0x38 and 3.0x12 mm Xience DES (overlapping) Percent Stenosis (post) 0 TIMI-flow (post) 3  Final Conclusions:   1. Acute anterior myocardial infarction secondary to total occlusion of the mid LAD, treated successfully with primary PCI using overlapping drug-eluting stents 2. 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 Severe segmental LV systolic dysfunction   Recommendations:  The patient will be loaded with 60 mg of effient in the CCU. He will be treated with aspirin and effient for a minimum of 12 months. Would continue Aggrastat for 18 hours if there are no bleeding complications. Post MI medical therapy when stabilized. CCM to manage the patient's respiratory failure and hypothermia.  Andre Moran 04/18/2014, 12:49 AM

## 2014-04-18 NOTE — Care Management Note (Signed)
    Page 1 of 1   04/18/2014     7:30:04 AM CARE MANAGEMENT NOTE 04/18/2014  Patient:  Andre Moran,Andre Moran   Account Number:  [id number]  Date Initiated:  04/18/2014  Documentation initiated by:  Junius Creamer  Subjective/Objective Assessment:   adm w arrst-vent     Action/Plan:   lives w wife   Anticipated DC Date:     Anticipated DC Plan:           Choice offered to / List presented to:             Status of service:   Medicare Important Message given?   (If response is "NO", the following Medicare IM given date fields will be blank) Date Medicare IM given:   Date Additional Medicare IM given:    Discharge Disposition:    Per UR Regulation:  Reviewed for med. necessity/level of care/duration of stay  If discussed at Long Length of Stay Meetings, dates discussed:    Comments:

## 2014-04-18 NOTE — Progress Notes (Signed)
eLink Physician-Brief Progress Note Patient Name: Andre Moran DOB: 01/08/1946 MRN: [id number]  Date of Service  04/18/2014   HPI/Events of Note     eICU Interventions  Hypokalemia -repleted    Intervention Category Intermediate Interventions: Electrolyte abnormality - evaluation and management  ALVA,RAKESH V. 04/18/2014, 9:10 PM

## 2014-04-18 NOTE — Progress Notes (Signed)
  Echocardiogram 2D Echocardiogram has been performed.  Arvil Chaco 04/18/2014, 10:39 AM

## 2014-04-18 NOTE — Procedures (Signed)
Central Venous Catheter Insertion Procedure Note Andre Moran [id number] [birth year]  Procedure: Insertion of Central Venous Catheter Indications: Assessment of intravascular volume, Drug and/or fluid administration and Frequent blood sampling  Procedure Details Consent: Risks of procedure as well as the alternatives and risks of each were explained to the (patient/caregiver).  Consent for procedure obtained. Time Out: Verified patient identification, verified procedure, site/side was marked, verified correct patient position, special equipment/implants available, medications/allergies/relevent history reviewed, required imaging and test results available.  Performed  Maximum sterile technique was used including antiseptics, cap, gloves, gown, hand hygiene, mask and sheet. Skin prep: Chlorhexidine; local anesthetic administered A antimicrobial bonded/coated triple lumen catheter was placed in the right internal jugular vein using the Seldinger technique.  Evaluation Blood flow good Complications: No apparent complications Patient did tolerate procedure well. Chest X-ray ordered to verify placement.  CXR: pending.  Procedure performed under direct ultrasound guidance for real time vessel cannulation.      Rutherford Guys, PA - [facility] [place] [facility] Pgr: [phone]  or [phone]

## 2014-04-18 NOTE — Progress Notes (Signed)
Patient's wife removed gold wedding ring from patient's left ring finger to take home.

## 2014-04-18 NOTE — Progress Notes (Signed)
eLink Physician-Brief Progress Note Patient Name: Andre Moran DOB: [birth year] MRN: [id number]  Date of Service  04/18/2014   HPI/Events of Note   Hypertension bradycardia  eICU Interventions  Hydralazine PRN written Hold metop    Intervention Category Major Interventions: Hypertension - evaluation and management  MCQUAID, DOUGLAS 04/18/2014, 5:38 AM

## 2014-04-19 ENCOUNTER — Inpatient Hospital Stay (HOSPITAL_COMMUNITY): Payer: Medicare Other

## 2014-04-19 DIAGNOSIS — I469 Cardiac arrest, cause unspecified: Secondary | ICD-10-CM

## 2014-04-19 DIAGNOSIS — A419 Sepsis, unspecified organism: Secondary | ICD-10-CM

## 2014-04-19 DIAGNOSIS — G40401 Other generalized epilepsy and epileptic syndromes, not intractable, with status epilepticus: Secondary | ICD-10-CM

## 2014-04-19 DIAGNOSIS — R6521 Severe sepsis with septic shock: Secondary | ICD-10-CM

## 2014-04-19 DIAGNOSIS — Z66 Do not resuscitate: Secondary | ICD-10-CM | POA: Diagnosis not present

## 2014-04-19 DIAGNOSIS — R6520 Severe sepsis without septic shock: Secondary | ICD-10-CM

## 2014-04-19 DIAGNOSIS — I498 Other specified cardiac arrhythmias: Secondary | ICD-10-CM

## 2014-04-19 DIAGNOSIS — J9600 Acute respiratory failure, unspecified whether with hypoxia or hypercapnia: Secondary | ICD-10-CM

## 2014-04-19 DIAGNOSIS — G40901 Epilepsy, unspecified, not intractable, with status epilepticus: Secondary | ICD-10-CM | POA: Diagnosis not present

## 2014-04-19 LAB — CBC
HCT: 47 % (ref 39.0–52.0)
Hemoglobin: 16.2 g/dL (ref 13.0–17.0)
MCH: 32.9 pg (ref 26.0–34.0)
MCHC: 34.5 g/dL (ref 30.0–36.0)
MCV: 95.5 fL (ref 78.0–100.0)
PLATELETS: 370 10*3/uL (ref 150–400)
RBC: 4.92 MIL/uL (ref 4.22–5.81)
RDW: 14.7 % (ref 11.5–15.5)
WBC: 18.6 10*3/uL — AB (ref 4.0–10.5)

## 2014-04-19 LAB — POCT I-STAT, CHEM 8
BUN: 9 mg/dL (ref 6–23)
BUN: 11 mg/dL (ref 6–23)
CALCIUM ION: 1.08 mmol/L — AB (ref 1.13–1.30)
CHLORIDE: 106 meq/L (ref 96–112)
Calcium, Ion: 1.09 mmol/L — ABNORMAL LOW (ref 1.13–1.30)
Chloride: 111 mEq/L (ref 96–112)
Creatinine, Ser: 0.5 mg/dL (ref 0.50–1.35)
Creatinine, Ser: 0.7 mg/dL (ref 0.50–1.35)
Glucose, Bld: 110 mg/dL — ABNORMAL HIGH (ref 70–99)
Glucose, Bld: 130 mg/dL — ABNORMAL HIGH (ref 70–99)
HCT: 43 % (ref 39.0–52.0)
HEMATOCRIT: 52 % (ref 39.0–52.0)
HEMOGLOBIN: 17.7 g/dL — AB (ref 13.0–17.0)
Hemoglobin: 14.6 g/dL (ref 13.0–17.0)
Potassium: 4.9 mEq/L (ref 3.7–5.3)
Potassium: 4.6 mEq/L (ref 3.7–5.3)
SODIUM: 136 meq/L — AB (ref 137–147)
Sodium: 137 mEq/L (ref 137–147)
TCO2: 18 mmol/L (ref 0–100)
TCO2: 18 mmol/L (ref 0–100)

## 2014-04-19 LAB — BASIC METABOLIC PANEL
BUN: 11 mg/dL (ref 6–23)
BUN: 11 mg/dL (ref 6–23)
BUN: 13 mg/dL (ref 6–23)
BUN: 14 mg/dL (ref 6–23)
CHLORIDE: 103 meq/L (ref 96–112)
CHLORIDE: 101 meq/L (ref 96–112)
CO2: 17 mEq/L — ABNORMAL LOW (ref 19–32)
CO2: 17 mEq/L — ABNORMAL LOW (ref 19–32)
CO2: 17 mEq/L — ABNORMAL LOW (ref 19–32)
CO2: 18 mEq/L — ABNORMAL LOW (ref 19–32)
CREATININE: 0.43 mg/dL — AB (ref 0.50–1.35)
Calcium: 7.2 mg/dL — ABNORMAL LOW (ref 8.4–10.5)
Calcium: 8.2 mg/dL — ABNORMAL LOW (ref 8.4–10.5)
Calcium: 8.3 mg/dL — ABNORMAL LOW (ref 8.4–10.5)
Calcium: 8.4 mg/dL (ref 8.4–10.5)
Chloride: 102 mEq/L (ref 96–112)
Chloride: 99 mEq/L (ref 96–112)
Creatinine, Ser: 0.55 mg/dL (ref 0.50–1.35)
Creatinine, Ser: 0.65 mg/dL (ref 0.50–1.35)
Creatinine, Ser: 1.07 mg/dL (ref 0.50–1.35)
GFR calc Af Amer: >90 mL/min (ref >90)
GFR calc Af Amer: 81 mL/min — ABNORMAL LOW (ref >90)
GFR calc non Af Amer: >90 mL/min (ref >90)
GFR calc non Af Amer: >90 mL/min (ref >90)
GFR calc non Af Amer: 70 mL/min — ABNORMAL LOW (ref >90)
GLUCOSE: 136 mg/dL — AB (ref 70–99)
GLUCOSE: 181 mg/dL — AB (ref 70–99)
Glucose, Bld: 103 mg/dL — ABNORMAL HIGH (ref 70–99)
Glucose, Bld: 138 mg/dL — ABNORMAL HIGH (ref 70–99)
POTASSIUM: 3.7 meq/L (ref 3.7–5.3)
POTASSIUM: 4.7 meq/L (ref 3.7–5.3)
Potassium: 3.4 mEq/L — ABNORMAL LOW (ref 3.7–5.3)
Potassium: 5.5 mEq/L — ABNORMAL HIGH (ref 3.7–5.3)
SODIUM: 137 meq/L (ref 137–147)
SODIUM: 137 meq/L (ref 137–147)
SODIUM: 138 meq/L (ref 137–147)
Sodium: 136 mEq/L — ABNORMAL LOW (ref 137–147)

## 2014-04-19 LAB — POCT I-STAT 3, ART BLOOD GAS (G3+)
ACID-BASE DEFICIT: 16 mmol/L — AB (ref 0.0–2.0)
Bicarbonate: 18.7 mEq/L — ABNORMAL LOW (ref 20.0–24.0)
O2 Saturation: 74 %
TCO2: 21 mmol/L (ref 0–100)
pCO2 arterial: 91.9 mmHg (ref 35.0–45.0)
pH, Arterial: 6.917 — CL (ref 7.350–7.450)
pO2, Arterial: 66 mmHg — ABNORMAL LOW (ref 80.0–100.0)

## 2014-04-19 LAB — GLUCOSE, CAPILLARY
GLUCOSE-CAPILLARY: 144 mg/dL — AB (ref 70–99)
GLUCOSE-CAPILLARY: 93 mg/dL (ref 70–99)
GLUCOSE-CAPILLARY: 131 mg/dL — AB (ref 70–99)
GLUCOSE-CAPILLARY: 123 mg/dL — AB (ref 70–99)
Glucose-Capillary: 145 mg/dL — ABNORMAL HIGH (ref 70–99)
Glucose-Capillary: 150 mg/dL — ABNORMAL HIGH (ref 70–99)
Glucose-Capillary: 156 mg/dL — ABNORMAL HIGH (ref 70–99)
Glucose-Capillary: 104 mg/dL — ABNORMAL HIGH (ref 70–99)
Glucose-Capillary: 142 mg/dL — ABNORMAL HIGH (ref 70–99)

## 2014-04-19 LAB — HEPATIC FUNCTION PANEL
ALT: 201 U/L — ABNORMAL HIGH (ref 0–53)
AST: 249 U/L — ABNORMAL HIGH (ref 0–37)
Albumin: 3.3 g/dL — ABNORMAL LOW (ref 3.5–5.2)
Alkaline Phosphatase: 99 U/L (ref 39–117)
Total Bilirubin: 0.4 mg/dL (ref 0.3–1.2)
Total Protein: 6.9 g/dL (ref 6.0–8.3)

## 2014-04-19 LAB — MAGNESIUM
Magnesium: 1.7 mg/dL (ref 1.5–2.5)
Magnesium: 1.4 mg/dL — ABNORMAL LOW (ref 1.5–2.5)

## 2014-04-19 LAB — PHOSPHORUS
PHOSPHORUS: 2.7 mg/dL (ref 2.3–4.6)
PHOSPHORUS: 6.4 mg/dL — AB (ref 2.3–4.6)

## 2014-04-19 LAB — PROCALCITONIN: Procalcitonin: 0.67 ng/mL

## 2014-04-19 MED ORDER — SODIUM CHLORIDE 0.9 % IV BOLUS (SEPSIS)
500.0000 mL | Freq: Once | INTRAVENOUS | Status: AC
  Administered 2014-04-19: 500 mL via INTRAVENOUS

## 2014-04-19 MED ORDER — VANCOMYCIN HCL 10 G IV SOLR
1500.0000 mg | Freq: Two times a day (BID) | INTRAVENOUS | Status: DC
  Administered 2014-04-19 – 2014-04-20 (×2): 1500 mg via INTRAVENOUS
  Filled 2014-04-19 (×3): qty 1500

## 2014-04-19 MED ORDER — POTASSIUM CHLORIDE 10 MEQ/50ML IV SOLN
10.0000 meq | INTRAVENOUS | Status: AC
  Administered 2014-04-19 (×3): 10 meq via INTRAVENOUS
  Filled 2014-04-19 (×3): qty 50

## 2014-04-19 MED ORDER — NOREPINEPHRINE BITARTRATE 1 MG/ML IV SOLN
0.0000 ug/min | INTRAVENOUS | Status: DC
  Administered 2014-04-19: 50 ug/min via INTRAVENOUS
  Filled 2014-04-19: qty 16

## 2014-04-19 MED ORDER — PANTOPRAZOLE SODIUM 40 MG PO PACK
40.0000 mg | PACK | Freq: Every day | ORAL | Status: DC
  Administered 2014-04-19: 40 mg
  Filled 2014-04-19: qty 20

## 2014-04-19 MED ORDER — LORAZEPAM 2 MG/ML IJ SOLN
1.0000 mg | INTRAMUSCULAR | Status: DC | PRN
  Administered 2014-04-19 (×3): 2 mg via INTRAVENOUS
  Filled 2014-04-19 (×3): qty 1

## 2014-04-19 MED ORDER — SODIUM CHLORIDE 0.9 % IV SOLN
500.0000 mg | Freq: Two times a day (BID) | INTRAVENOUS | Status: DC
  Filled 2014-04-19: qty 5

## 2014-04-19 MED ORDER — LEVOFLOXACIN IN D5W 750 MG/150ML IV SOLN
750.0000 mg | INTRAVENOUS | Status: DC
  Administered 2014-04-19: 750 mg via INTRAVENOUS
  Filled 2014-04-19 (×2): qty 150

## 2014-04-19 MED ORDER — SODIUM CHLORIDE 0.9 % IV SOLN
1000.0000 mg | Freq: Once | INTRAVENOUS | Status: AC
  Administered 2014-04-19: 1000 mg via INTRAVENOUS
  Filled 2014-04-19: qty 10

## 2014-04-19 MED ORDER — SODIUM CHLORIDE 0.9 % IV SOLN
400.0000 mg | Freq: Once | INTRAVENOUS | Status: AC
  Administered 2014-04-19: 400 mg via INTRAVENOUS
  Filled 2014-04-19: qty 40

## 2014-04-19 MED ORDER — SODIUM CHLORIDE 0.9 % IV SOLN: 1.0000 mg/h | INTRAVENOUS | Status: DC

## 2014-04-19 MED ORDER — LORAZEPAM 2 MG/ML IJ SOLN
INTRAMUSCULAR | Status: AC
  Administered 2014-04-19: 2 mg
  Filled 2014-04-19: qty 5

## 2014-04-19 MED ORDER — SODIUM CHLORIDE 0.9 % IV BOLUS (SEPSIS)
500.0000 mL | INTRAVENOUS | Status: DC | PRN
  Administered 2014-04-19 (×4): 500 mL via INTRAVENOUS

## 2014-04-19 MED ORDER — VALPROATE SODIUM 500 MG/5ML IV SOLN
500.0000 mg | Freq: Two times a day (BID) | INTRAVENOUS | Status: DC
  Administered 2014-04-19: 500 mg via INTRAVENOUS
  Filled 2014-04-19 (×2): qty 5

## 2014-04-19 MED ORDER — ALBUMIN HUMAN 5 % IV SOLN
25.0000 g | Freq: Once | INTRAVENOUS | Status: AC
  Administered 2014-04-19: 25 g via INTRAVENOUS
  Filled 2014-04-19: qty 500

## 2014-04-19 MED ORDER — LORAZEPAM 2 MG/ML IJ SOLN
10.0000 mg | Freq: Once | INTRAMUSCULAR | Status: AC
  Administered 2014-04-19: 10 mg via INTRAVENOUS

## 2014-04-19 MED ORDER — SODIUM CHLORIDE 0.9 % IV SOLN
75.0000 mL/h | INTRAVENOUS | Status: DC
  Administered 2014-04-19: 75 mL/h via INTRAVENOUS

## 2014-04-19 NOTE — Progress Notes (Signed)
Nimbex was turned off at 1815 this evening around 1900 pt began to have seizure like activity with drops in O2 and BP. MD made new orders for sedation and keppra with boluses of NS for BP. Neuro was consulted and came to bedside. Portable head CT and chest xray were obtained and neurology and CCM adjusted medications accordingly. Nimbex was resumed for the head CT in order to keep pt still during procedure. Wife very tearful and distraught over situation chaplain called and came to waiting room for support. Both CCM and neurology were at bedside with wife and other family members discussing plan of care. Wife at bedside with multiple support systems. Continuing current plan of care and reassessing once paralytic has cleared system neurology can do a full assessment.

## 2014-04-19 NOTE — Progress Notes (Signed)
RT did not do a first round vent check due to recurrent seizures, portable head CT, and doctor meeting with family. Informed the nurse of this. Nurse says possible terminal extubation tonight. RT will continue to check on patient.

## 2014-04-19 NOTE — Progress Notes (Signed)
Nimbex has started to clear system. Pt has begun twitching and moving more as the medication clears. BP drops with seizures MD made aware. At this time we will not add more pressors. Will continue to assess and monitor.

## 2014-04-19 NOTE — Progress Notes (Signed)
Acute PCCM Evaluation  S: Seizures after rewarming.  Also with progressive hypoxia.  Received paralytic x one dose to allow for portable CT head to be performed.  O: Temp:  [89.6 F (32 C)-97.3 F (36.3 C)] 97.3 F (36.3 C) (06/17 1900) Pulse Rate:  [64-98] 98 (06/17 1856) Resp:  [24-26] 26 (06/17 1900) BP: (88-158)/(40-90) 96/48 mmHg (06/17 1856) SpO2:  [90 %-97 %] 91 % (06/17 1900) Arterial Line BP: (77-149)/(48-93) 119/60 mmHg (06/17 1900) FiO2 (%):  [50 %-100 %] 100 % (06/17 1856)  Intake/Output Summary (Last 24 hours) at 04/19/14 2009 Last data filed at 04/19/14 1900  Gross per 24 hour  Intake 2918.2 ml  Output    405 ml  Net 2513.2 ml   CVP:  [2 mmHg-8 mmHg] 3 mmHg Vent Mode:  [-] PRVC FiO2 (%):  [50 %-100 %] 100 % Set Rate:  [24 bmp] 24 bmp Vt Set:  [550 mL] 550 mL PEEP:  [5 cmH20] 5 cmH20 Plateau Pressure:  [18 cmH20-21 cmH20] 18 cmH20  General: ill appearing  HEENT: ETT in place Cardiac: regular Chest: b/l rales Abd: soft Ext: 1+ edema Neuro: paralyzed Skin: no rashes  CBC Recent Labs     04/25/2014  2254  04/18/14  0230   04/18/14  0800   04/19/14  0410  04/19/14  0811  04/19/14  1538  WBC  15.3*  21.3*   --    --    --   18.6*   --    --   HGB  12.0*  14.0   < >   --    < >  16.2  17.7*  14.6  HCT  36.7*  41.3   < >   --    < >  47.0  52.0  43.0  PLT  338  344   --   373   --   370   --    --    < > = values in this interval not displayed.    Coag's Recent Labs     04/21/2014  2254  04/18/14  0800  APTT  34  37  INR  1.13   --     BMET Recent Labs    04/19/14  04/19/14  0410  04/19/14  0811  04/19/14  1130  04/19/14  1538  NA  137  136*  136*  137  137  K  3.4*  3.7  4.9  5.5*  4.6  CL  99  103  111  101  106  CO2  18*  17*   --   17*   --   BUN  11  11  9  13  11   CREATININE  0.55  0.43*  0.50  0.65  0.70  GLUCOSE  138*  103*  110*  136*  130*    Electrolytes Recent Labs     04/18/14  0230  04/18/14  0800  04/19/14   04/19/14  0410  04/19/14  1130  CALCIUM  7.8*  8.4  8.3*   < >  8.4  8.3*  8.2*  MG  1.7  2.4   --    --   1.7   --   PHOS  1.4*  1.0*   --    --   2.7   --    < > = values in this interval not displayed.    Sepsis Markers Recent Labs     04/19/14  1130  PROCALCITON  0.67    ABG Recent Labs     04/18/14  0417  04/18/14  1127  04/19/14  1937  PHART  7.431  7.438  6.917*  PCO2ART  24.8*  23.1*  91.9*  PO2ART  58.0*  47.0*  66.0*    Liver Enzymes Recent Labs     04/19/14  0410  AST  249*  ALT  201*  ALKPHOS  99  BILITOT  0.4  ALBUMIN  3.3*    Cardiac Enzymes Recent Labs     04/18/14  0230  04/18/14  0800  04/18/14  1426  TROPONINI  >20.00*  >20.00*  >20.00*  PROBNP   --   737.1*   --     Glucose Recent Labs     04/18/14  2221  04/18/14  2336  04/19/14  0400  04/19/14  0808  04/19/14  1131  04/19/14  1535  GLUCAP  144*  145*  93  104*  131*  123*    Imaging Dg Chest Port 1 View  04/19/2014   CLINICAL DATA:  Hypoxia, check endo tracheal tube placement.  EXAM: PORTABLE CHEST - 1 VIEW  COMPARISON:  April 19, 2014 4:56 a.m.  FINDINGS: The heart size and mediastinal contours are stable. The heart size is enlarged. There is pulmonary edema. There is consolidation of both lung bases with bilateral pleural effusions unchanged. Endotracheal tube is identified distal tip 4.3 cm from carina. Right jugular central venous line and nasogastric tube are unchanged. The visualized skeletal structures are stable.  IMPRESSION: Endotracheal tube in good position with distal tip 4.3 cm from carina. Pulmonary edema. Consolidation of both lung bases with bilateral pleural effusions.   Electronically Signed   By: Sherian ReinWei-Chen  Lin M.D.   On: 04/19/2014 19:57   Dg Chest Port 1 View  04/19/2014   CLINICAL DATA:  Respiratory failure.  EXAM: PORTABLE CHEST - 1 VIEW  COMPARISON:  Chest x-ray 04/18/2014.  FINDINGS: An endotracheal tube is in place with tip 2.5 cm above the carina. There is  a right-sided internal jugular central venous catheter with tip terminating in the mid superior vena cava. A nasogastric tube is seen extending into the stomach, however, the tip of the nasogastric tube extends below the lower margin of the image. Transcutaneous defibrillator pads are seen projecting over the upper thorax and epigastric region. Lung volumes are low. There has been substantial worsening of bibasilar opacities (right greater than left), compatible with increasing areas of atelectasis and/or consolidation. This is associated with small left and small to moderate right pleural effusions. No evidence of pulmonary edema. Heart size is borderline enlarged. Upper mediastinal contours are within normal limits. Atherosclerosis in the thoracic aorta.  IMPRESSION: 1. Support apparatus, as above. 2. Substantial worsening aeration throughout the lung bases bilaterally related to enlarging moderate right and small left pleural effusions with substantially increased atelectasis and/or consolidation throughout the lung bases.   Electronically Signed   By: Trudie Reedaniel  Entrikin M.D.   On: 04/19/2014 08:04   Dg Chest Port 1 View  04/18/2014   CLINICAL DATA:  Central line placement.  EXAM: PORTABLE CHEST - 1 VIEW  COMPARISON:  04/05/2014, 2304 hr.  FINDINGS: Right IJ central line is present with the tip in the mid SVC. Pulmonary aeration has worsened with decreasing volumes and increasing perihilar pulmonary edema. Interstitial pulmonary edema has progressed alveolar edema at the bases. Endotracheal tube and nasogastric tube unchanged defibrillator pads continued overlie the chest.  IMPRESSION:  1. Uncomplicated right IJ central line placement. 2. Other support apparatus stable. 3. Worsening pulmonary edema/CHF.   Electronically Signed   By: Andreas Newport M.D.   On: 04/18/2014 02:09   Dg Chest Port 1 View  04/18/2014   CLINICAL DATA:  Endotracheal tube placement.  CPR.  EXAM: PORTABLE CHEST - 1 VIEW  COMPARISON:   None.  FINDINGS: Defibrillator pads overlie the chest. Endotracheal tube tip appears 4 cm from the carina. Enteric tube is present with the tip not visualized. Lung volumes are low. There is diffuse interstitial prominence. This is suggestive of interstitial pulmonary edema. The cardiopericardial silhouette appears enlarged.  IMPRESSION: 1. Endotracheal tube tip 4 cm from the carina. Enteric tube tip not visualized 2. Low volume chest with interstitial pulmonary edema and cardiomegaly.   Electronically Signed   By: Andreas Newport M.D.   On: 04/18/2014 00:35   Impression:  Acute respiratory failure with HCAP, pulmonary edema, ARDS. Plan: ARDS protocol F/u CXR, ABG  Status epilepticus in setting of cardiac arrest with concern for anoxic encephalopathy. Plan: Keppra, vimpat per neurology F/u CT head, EEG results Adjust versed to suppress seizure activity  VF arrest from STEMI and acute systolic heart failure s/p PTCA to LAD. Plan: ASA, lipitor, effient  Septic shock 2nd to HCAP.  Chronic immunosuppression with hx of temporal arteritis on MTX/steroids as outpt Plan: Stress steroids Pressors to keep MAP > 65 IV fluids  Updated pt's family with assistance of Dr. Thad Ranger with neurology service.  Confirmed DNR status in the event of cardiac arrest.  Family would like to see if seizures can be controlled with medication.  Will re-assess after paralytic medicine wears off.  If seizures unable to be controlled, then family would likely consider transition to comfort measures only.  Also family would not want to continue life support if he does not have chance for meaningful neurologic recovery.  CC time 50 minutes.  Coralyn Helling, MD [facility] Pulmonary/Critical Care 04/19/2014, 8:15 PM Pager:  [phone] After 3pm call: [phone]

## 2014-04-19 NOTE — Consult Note (Signed)
Reason for Consult:Status Epilepticus Referring Physician: Vassie Loll  CC: Seizures  HPI: Andre Moran is an 68 y.o. male who suffered vfib arrest at home with ROSC in 15 minutes.  Patient was brought to the ED where he was found to have a STEMI on 04/30/2014.  Cath showed occluded proximal to mid LAD with severe LV dysfunction treated with primary PCI.  Patient was placed on hypothermia protocol.  Phosphorus on 6/16 was 1.0.  Repeat this morning was 2.7.  Magnesium was normal at 1.7.  Patient has been acidotic and hypoxic.  ARDS on CXR.  Requires pressors.  Warming started today.  Reached goal temperature at 1900.  When Nimbex discontinued seizure activity was noted.  Seizures continued despite Versed infusion, Keppra load and Ativan.    Past Medical History  Diagnosis Date  . HTN (hypertension)   . HLD (hyperlipidemia)   . Temporal arteritis     Past Surgical History  Procedure Laterality Date  . No past surgeries      Family History  Problem Relation Age of Onset  . Heart attack Father 34  . Coronary artery disease Mother     5 stents    Social History:  reports that he has never smoked. He does not have any smokeless tobacco history on file. He reports that he does not drink alcohol or use illicit drugs.  No Known Allergies  Medications:  I have reviewed the patient's current medications. Prior to Admission:  Prescriptions prior to admission  Medication Sig Dispense Refill  . albuterol (PROVENTIL HFA;VENTOLIN HFA) 108 (90 BASE) MCG/ACT inhaler Inhale 1 puff into the lungs every 6 (six) hours as needed for wheezing or shortness of breath.      Marland Kitchen aspirin 81 MG tablet Take 81 mg by mouth daily.      Marland Kitchen atorvastatin (LIPITOR) 10 MG tablet Take 10 mg by mouth daily.      . Cyanocobalamin (VITAMIN B-12 PO) Take 1 tablet by mouth daily.      . Eszopiclone 3 MG TABS Take 3 mg by mouth at bedtime as needed (sleep). Take immediately before bedtime      . folic acid (FOLVITE) 1 MG tablet  Take 1 mg by mouth daily.      . irbesartan (AVAPRO) 150 MG tablet Take 150 mg by mouth daily.      Marland Kitchen leucovorin (WELLCOVORIN) 10 MG tablet Take 10 mg by mouth once a week. Take on saturdays (one day after methotrexate)      . Methotrexate, Anti-Rheumatic, (METHOTREXATE, PF, [place]) Inject 0.8 mg into the skin once a week. Every Friday      . Multiple Vitamins-Minerals (MULTIVITAMIN WITH MINERALS) tablet Take 1 tablet by mouth daily.      . predniSONE (DELTASONE) 1 MG tablet Take 8 mg by mouth daily. Taper 8 mg daily for 1 week then 7 mg daily for 3 weeks      . risedronate (ACTONEL) 150 MG tablet Take 150 mg by mouth every 30 (thirty) days. with water on empty stomach, nothing by mouth or lie down for next 30 minutes.      . sertraline (ZOLOFT) 100 MG tablet Take 100 mg by mouth daily.      Marland Kitchen triamcinolone (NASACORT) 55 MCG/ACT AERO nasal inhaler Place 1 spray into the nose daily as needed (allergies).      . valACYclovir (VALTREX) 500 MG tablet Take 500 mg by mouth daily as needed (outbreaks).       Scheduled: . antiseptic  oral rinse  15 mL Mouth Rinse QID  . aspirin  81 mg Per Tube Daily  . atorvastatin  80 mg Per Tube q1800  . chlorhexidine  15 mL Mouth Rinse BID  . heparin subcutaneous  5,000 Units Subcutaneous 3 times per day  . hydrocortisone sod succinate (SOLU-CORTEF) inj  50 mg Intravenous Q6H  . insulin aspart  2-6 Units Subcutaneous 6 times per day  . lacosamide (VIMPAT) IV  400 mg Intravenous Once  . [START ON 04/27/14] levETIRAcetam  500 mg Intravenous Q12H  . levofloxacin (LEVAQUIN) IV  750 mg Intravenous Q24H  . pantoprazole sodium  40 mg Per Tube Daily  . prasugrel  10 mg Oral Daily  . vancomycin  1,500 mg Intravenous Q12H    ROS: Unable to obtain  Physical Examination: Blood pressure 96/48, pulse 98, temperature 97.3 F (36.3 C), temperature source Core (Comment), resp. rate 26, height 6\' 1"  (1.854 m), weight 95.31 kg (210 lb 1.9 oz), SpO2 91.00%.  Neurologic  Examination: s/p Nimbex Mental Status: Patient does not respond to verbal stimuli.  Does not respond to deep sternal rub.  Does not follow commands.  No verbalizations noted.  Cranial Nerves: II: patient does not respond confrontation bilaterally, pupils right 3 mm, left 3 mm,and unreactive bilaterally III,IV,VI: doll's response absent bilaterally.  V,VII: corneal reflex absent bilaterally  VIII: patient does not respond to verbal stimuli IX,X: gag reflex absent, XI: trapezius strength unable to test bilaterally XII: tongue strength unable to test Motor: Extremities flaccid throughout.  No spontaneous movement noted.  No purposeful movements noted. Sensory: Does not respond to noxious stimuli in any extremity. Deep Tendon Reflexes:  Absent throughout. Plantars: absent bilaterally Cerebellar: Unable to perform    Laboratory Studies:   Basic Metabolic Panel:  Recent Labs Lab 04/18/2014 2254 04/18/14 0230  04/18/14 0800  04/18/14 2000 04/19/14 04/19/14 0410 04/19/14 0811 04/19/14 1130 04/19/14 1538  NA 141 142  < > 140  138  < > 137 137 136* 136* 137 137  K 3.3* 3.5*  < > 4.1  4.0  < > 3.5* 3.4* 3.7 4.9 5.5* 4.6  CL 103 104  < > 104  102  < > 101 99 103 111 101 106  CO2 14* 20  --  19  17*  --  17* 18* 17*  --  17*  --   GLUCOSE 253* 173*  < > 234*  225*  < > 166* 138* 103* 110* 136* 130*  BUN 14 13  < > 12  12  < > 11 11 11 9 13 11   CREATININE 1.24 0.79  < > 0.67  0.67  < > 0.53 0.55 0.43* 0.50 0.65 0.70  CALCIUM 8.2* 7.8*  --  8.4  8.3*  --  8.0* 8.4 8.3*  --  8.2*  --   MG  --  1.7  --  2.4  --   --   --  1.7  --   --   --   PHOS  --  1.4*  --  1.0*  --   --   --  2.7  --   --   --   < > = values in this interval not displayed.  Liver Function Tests:  Recent Labs Lab 04/19/14 0410  AST 249*  ALT 201*  ALKPHOS 99  BILITOT 0.4  PROT 6.9  ALBUMIN 3.3*   No results found for this basename: LIPASE, AMYLASE,  in the last 168 hours No  results found for this  basename: AMMONIA,  in the last 168 hours  CBC:  Recent Labs Lab 05/02/2014 2254 04/18/14 0230  04/18/14 0800  04/18/14 1229 04/18/14 1612 04/19/14 0410 04/19/14 0811 04/19/14 1538  WBC 15.3* 21.3*  --   --   --   --   --  18.6*  --   --   HGB 12.0* 14.0  < >  --   < > 16.3 16.3 16.2 17.7* 14.6  HCT 36.7* 41.3  < >  --   < > 48.0 48.0 47.0 52.0 43.0  MCV 98.9 97.2  --   --   --   --   --  95.5  --   --   PLT 338 344  --  373  --   --   --  370  --   --   < > = values in this interval not displayed.  Cardiac Enzymes:  Recent Labs Lab 04/18/14 0230 04/18/14 0800 04/18/14 1426  TROPONINI >20.00* >20.00* >20.00*    BNP: No components found with this basename: POCBNP,   CBG:  Recent Labs Lab 04/19/14 0400 04/19/14 0808 04/19/14 1131 04/19/14 1535 04/19/14 2006  GLUCAP 93 104* 131* 123* 142*    Microbiology: Results for orders placed during the hospital encounter of 04/15/2014  MRSA PCR SCREENING     Status: None   Collection Time    04/18/14  1:06 AM      Result Value Ref Range Status   MRSA by PCR NEGATIVE  NEGATIVE Final   Comment:            The GeneXpert MRSA Assay (FDA     approved for NASAL specimens     only), is one component of a     comprehensive MRSA colonization     surveillance program. It is not     intended to diagnose MRSA     infection nor to guide or     monitor treatment for     MRSA infections.    Coagulation Studies:  Recent Labs  04/08/2014 2254  LABPROT 14.3  INR 1.13    Urinalysis: No results found for this basename: COLORURINE, APPERANCEUR, LABSPEC, PHURINE, GLUCOSEU, HGBUR, BILIRUBINUR, KETONESUR, PROTEINUR, UROBILINOGEN, NITRITE, LEUKOCYTESUR,  in the last 168 hours  Lipid Panel:  No results found for this basename: chol,  trig,  hdl,  cholhdl,  vldl,  ldlcalc    HgbA1C:  No results found for this basename: HGBA1C    Urine Drug Screen:   No results found for this basename: labopia,  cocainscrnur,  labbenz,  amphetmu,   thcu,  labbarb    Alcohol Level: No results found for this basename: ETH,  in the last 168 hours  Other results: EKG: sinus bradycardia at 43 bpm.  Imaging: Dg Chest Port 1 View  04/19/2014   CLINICAL DATA:  Hypoxia, check endo tracheal tube placement.  EXAM: PORTABLE CHEST - 1 VIEW  COMPARISON:  April 19, 2014 4:56 a.m.  FINDINGS: The heart size and mediastinal contours are stable. The heart size is enlarged. There is pulmonary edema. There is consolidation of both lung bases with bilateral pleural effusions unchanged. Endotracheal tube is identified distal tip 4.3 cm from carina. Right jugular central venous line and nasogastric tube are unchanged. The visualized skeletal structures are stable.  IMPRESSION: Endotracheal tube in good position with distal tip 4.3 cm from carina. Pulmonary edema. Consolidation of both lung bases with bilateral pleural effusions.   Electronically  Signed   By: Sherian Rein M.D.   On: 04/19/2014 19:57   Dg Chest Port 1 View  04/19/2014   CLINICAL DATA:  Respiratory failure.  EXAM: PORTABLE CHEST - 1 VIEW  COMPARISON:  Chest x-ray 04/18/2014.  FINDINGS: An endotracheal tube is in place with tip 2.5 cm above the carina. There is a right-sided internal jugular central venous catheter with tip terminating in the mid superior vena cava. A nasogastric tube is seen extending into the stomach, however, the tip of the nasogastric tube extends below the lower margin of the image. Transcutaneous defibrillator pads are seen projecting over the upper thorax and epigastric region. Lung volumes are low. There has been substantial worsening of bibasilar opacities (right greater than left), compatible with increasing areas of atelectasis and/or consolidation. This is associated with small left and small to moderate right pleural effusions. No evidence of pulmonary edema. Heart size is borderline enlarged. Upper mediastinal contours are within normal limits. Atherosclerosis in the thoracic  aorta.  IMPRESSION: 1. Support apparatus, as above. 2. Substantial worsening aeration throughout the lung bases bilaterally related to enlarging moderate right and small left pleural effusions with substantially increased atelectasis and/or consolidation throughout the lung bases.   Electronically Signed   By: Trudie Reed M.D.   On: 04/19/2014 08:04   Dg Chest Port 1 View  04/18/2014   CLINICAL DATA:  Central line placement.  EXAM: PORTABLE CHEST - 1 VIEW  COMPARISON:  05/08/14, 2304 hr.  FINDINGS: Right IJ central line is present with the tip in the mid SVC. Pulmonary aeration has worsened with decreasing volumes and increasing perihilar pulmonary edema. Interstitial pulmonary edema has progressed alveolar edema at the bases. Endotracheal tube and nasogastric tube unchanged defibrillator pads continued overlie the chest.  IMPRESSION: 1. Uncomplicated right IJ central line placement. 2. Other support apparatus stable. 3. Worsening pulmonary edema/CHF.   Electronically Signed   By: Andreas Newport M.D.   On: 04/18/2014 02:09   Dg Chest Port 1 View  04/18/2014   CLINICAL DATA:  Endotracheal tube placement.  CPR.  EXAM: PORTABLE CHEST - 1 VIEW  COMPARISON:  None.  FINDINGS: Defibrillator pads overlie the chest. Endotracheal tube tip appears 4 cm from the carina. Enteric tube is present with the tip not visualized. Lung volumes are low. There is diffuse interstitial prominence. This is suggestive of interstitial pulmonary edema. The cardiopericardial silhouette appears enlarged.  IMPRESSION: 1. Endotracheal tube tip 4 cm from the carina. Enteric tube tip not visualized 2. Low volume chest with interstitial pulmonary edema and cardiomegaly.   Electronically Signed   By: Andreas Newport M.D.   On: 04/18/2014 00:35   Ct Portable Head W/o Cm  04/19/2014   CLINICAL DATA:  Seizures , concern for anoxic damage , cerebral edema  EXAM: CT HEAD WITHOUT CONTRAST  TECHNIQUE: Contiguous axial images were obtained  from the base of the skull through the vertex without intravenous contrast.  COMPARISON:  None.  FINDINGS: There is no evidence of acute hemorrhage, extra-axial fluid collections nor mass effect. The ventricles and cisterns are patent. Chronic region of infarction is appreciated within the right putamen. Gray-white matter differentiation is poorly evaluated on portable head CT. Further evaluation with dedicated conventional head CT scanning is recommended. No focal or acute calvarial abnormalities. Mucosal thickening is appreciated within the maxillary sinuses. The mastoid air cells are patent. An endotracheal and enteric tube is appreciated.  IMPRESSION: No evidence of acute hemorrhage. There is no evidence of mass effect.  Gray-white matter differentiation is poorly evaluated on the portable head CT and further evaluation with dedicated conventional head CT is recommended.  Chronic infarct right putamen.   Electronically Signed   By: Salome HolmesHector  Cooper M.D.   On: 04/19/2014 20:57     Assessment/Plan: 68 year old male s/p arrest and hypothermia protocol.  With discontinuation of paralytic patient was noted to have generalized tonic-clonic seizure activity.  Activity was unable to be aborted with high doses of Lorazepam and Versed and a load of Keppra.  Nimbex was re-administered so that a CT could be performed.   At this point no reliable neurological examination can be performed.  Patient was previously actively seizing and now is paralyzed.  The concern is for the difficult to control seizure activity that suggests significant underlying cerebral pathology.  Metabolic status has been addressed.  Prognosis at this time is poor for functional recovery.  Head CT reviewed and shows no evidence of hemorrhage.  I agree with the radiologist that reading of diffuse edema is difficult on portable imaging.  Some sulci are present.    Recommendations: 1.  Versed increased to 12mg /hr. Would not start Propofol at this  time due to difficulty maintaining blood pressure. 2.  Vimpat 400mg  IV STAT 3.  Stat serum magnesium and phosphorus 4.  Seizure precautions 5.  Once seizures controlled clinically will have long term electroencephalogram monitoring performed to rule out continued electrographic seizure activity.    This patient is critically ill and at significant risk of neurological worsening, death and care requires constant monitoring of vital signs, hemodynamics,respiratory and cardiac monitoring, neurological assessment, discussion with family, other specialists and medical decision making of high complexity. I spent 75 minutes of neurocritical care time  in the care of  this patient.  Thana FarrLeslie Khaleesi Gruel, MD [facility] [phone] 04/19/2014  9:11 PM

## 2014-04-19 NOTE — Progress Notes (Signed)
eLink Physician-Brief Progress Note Patient Name: Andre Moran DOB: 12-Aug-1946 MRN: [id number]  Date of Service  04/19/2014   HPI/Events of Note   Seizures on rewarm when nimbex turned off  eICU Interventions  Versed bolus & increase gtt keppra load Neuro input Will need head CT    Intervention Category Major Interventions: Seizures - evaluation and management  ALVA,RAKESH V. 04/19/2014, 7:04 PM

## 2014-04-19 NOTE — Progress Notes (Signed)
PULMONARY / CRITICAL CARE MEDICINE   Name: Andre Moran MRN: [id number] DOB: 1946-04-18    ADMISSION DATE:  04/13/2014 CONSULTATION DATE:  05/01/2014  REFERRING MD :  EDP PRIMARY SERVICE: PCCM  CHIEF COMPLAINT:  VF cardiac arrest  BRIEF PATIENT DESCRIPTION: 68 y.o. M who suffered v.fib arrest at home with ROSC in 15 minutes.  Brought to ED, GCS 3 and found to have STEMI.  LHC and PTCA to LAD  SIGNIFICANT EVENTS / STUDIES:  6/15 Admitted post VF arrest. Hypothermia protocol initiated  6/15 LHC: angioplasty and DES to mid LAD. LVEF 30% 6/16 Off vasopressors. Sinus bradycardia in 40s with freq ventricular beats (PVCs vs escape beats) 6/17 Rewarming initiated @ 5AM. Increasing O2 reqt's. Back on vasopressors. New RLL AS dz and/or effusion. Leukocytosis. Empiric abx initiated  LINES / TUBES: ETT 6/15 >>  R IJ CVL  6/16  >>  L radial A-line 6/16 >>   CULTURES: Resp 6/17 >>   ANTIBIOTICS: Vanc 6/17 >>  Levofloxacin 6/17 >>    SUBJECTIVE:  Rewarming initiated @ 5AM. Increasing O2 reqt's. Back on vasopressors  VITAL SIGNS: Temp:  [89.6 F (32 C)-94.8 F (34.9 C)] 94.8 F (34.9 C) (06/17 1500) Pulse Rate:  [64-87] 77 (06/17 1530) Resp:  [16-24] 24 (06/17 1530) BP: (88-158)/(40-92) 98/42 mmHg (06/17 1500) SpO2:  [91 %-98 %] 92 % (06/17 1530) Arterial Line BP: (77-170)/(48-100) 97/50 mmHg (06/17 1500) FiO2 (%):  [50 %-70 %] 70 % (06/17 1530) HEMODYNAMICS: CVP:  [2 mmHg-8 mmHg] 3 mmHg VENTILATOR SETTINGS: Vent Mode:  [-] PRVC FiO2 (%):  [50 %-70 %] 70 % Set Rate:  [24 bmp] 24 bmp Vt Set:  [550 mL] 550 mL PEEP:  [5 cmH20] 5 cmH20 Plateau Pressure:  [18 cmH20-21 cmH20] 18 cmH20 INTAKE / OUTPUT: Intake/Output     06/16 0701 - 06/17 0700 06/17 0701 - 06/18 0700   I.V. (mL/kg) 933.9 (9.8) 435 (4.6)   NG/GT     IV Piggyback 750 1150   Total Intake(mL/kg) 1683.9 (17.7) 1585 (16.6)   Urine (mL/kg/hr) 1780 (0.8) 120 (0.1)   Total Output 1780 120   Net -96.1 +1465           PHYSICAL EXAMINATION: General: sedated, paralyzed Neuro: unable to perform due to NMB HEENT: [place]/AT. PERRL Cardiovascular: brady, no M Lungs: Clear Abdomen: soft, +BS Ext: cool, no edema   LABS: I have reviewed all of today's lab results. 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 Bradycardia Ventricular ectopy P:  Complete hypothermia protocol Cards managing Cont current Rx inc ASA, statin, anticoagulation  RENAL A:   Oliguria P:   Monitor BMET intermittently Monitor I/Os Correct electrolytes as indicated  GASTROINTESTINAL A:   No issues P:   SUP: IV PPI Consider TFs 6/18  HEMATOLOGIC A:  No issues P:  DVT px: full anticoagulation Monitor CBC intermittently Transfuse per usual ICU guidelines  INFECTIOUS A:   Immunosuppressed (chronic steroids, MTX for temporal arteritis) Concern for early HCAP P:   Monitor WBC, temp Micro and abx as above  ENDOCRINE A:   Hyperglycemia due to stress, steroids Chronic steroids (temporal arteritis) P:   Cont SSI  Stress dose steroids  NEUROLOGIC A:   Post anoxic encephalopathy P:   Cont sedation/analgesia per hypothermia protocol  Wife updated in detail @ bedside 45 mins CCM time  Billy Fischeravid Carmino Ocain, MD ; [facility] Mobile [phone].  After 5:30 PM or weekends, call [phone]  04/19/2014, 3:34 PM

## 2014-04-19 NOTE — Progress Notes (Signed)
ANTIBIOTIC CONSULT NOTE - INITIAL  Pharmacy Consult for vanc  Indication: pneumonia  No Known Allergies  Patient Measurements: Height: 6\' 1"  (185.4 cm) Weight: 210 lb 1.9 oz (95.31 kg) IBW/kg (Calculated) : 79.9 Adjusted Body Weight:   Vital Signs: Temp: 92.1 F (33.4 C) (06/17 1000) Temp src: Core (Comment) (06/17 1000) BP: 94/90 mmHg (06/17 0842) Pulse Rate: 85 (06/17 0900) Intake/Output from previous day: 06/16 0701 - 06/17 0700 In: 1683.9 [I.V.:933.9; IV Piggyback:750] Out: 1780 [Urine:1780] Intake/Output from this shift: Total I/O In: 102.8 [I.V.:102.8] Out: 65 [Urine:65]  Labs:  Recent Labs  December 06, 2013 2254 04/18/14 0230  04/18/14 0800  04/18/14 1612  04/19/14 04/19/14 0410 04/19/14 0811  WBC 15.3* 21.3*  --   --   --   --   --   --  18.6*  --   HGB 12.0* 14.0  < >  --   < > 16.3  --   --  16.2 17.7*  PLT 338 344  --  373  --   --   --   --  370  --   CREATININE 1.24 0.79  < > 0.67  0.67  < > 0.50  < > 0.55 0.43* 0.50  < > = values in this interval not displayed. Estimated Creatinine Clearance: 101.3 ml/min (by C-G formula based on Cr of 0.5). No results found for this basename: VANCOTROUGH, Leodis BinetVANCOPEAK, VANCORANDOM, GENTTROUGH, GENTPEAK, GENTRANDOM, TOBRATROUGH, TOBRAPEAK, TOBRARND, AMIKACINPEAK, AMIKACINTROU, AMIKACIN,  in the last 72 hours   Microbiology: Recent Results (from the past 720 hour(s))  MRSA PCR SCREENING     Status: None   Collection Time    04/18/14  1:06 AM      Result Value Ref Range Status   MRSA by PCR NEGATIVE  NEGATIVE Final   Comment:            The GeneXpert MRSA Assay (FDA     approved for NASAL specimens     only), is one component of a     comprehensive MRSA colonization     surveillance program. It is not     intended to diagnose MRSA     infection nor to guide or     monitor treatment for     MRSA infections.    Medical History: Past Medical History  Diagnosis Date  . HTN (hypertension)   . HLD (hyperlipidemia)   .  Temporal arteritis     Medications:  Scheduled:  . albumin human  25 g Intravenous Once  . antiseptic oral rinse  15 mL Mouth Rinse QID  . artificial tears  1 application Both Eyes 3 times per day  . aspirin  81 mg Per Tube Daily  . atorvastatin  80 mg Per Tube q1800  . chlorhexidine  15 mL Mouth Rinse BID  . heparin subcutaneous  5,000 Units Subcutaneous 3 times per day  . hydrocortisone sod succinate (SOLU-CORTEF) inj  50 mg Intravenous Q6H  . insulin aspart  2-6 Units Subcutaneous 6 times per day  . levofloxacin (LEVAQUIN) IV  750 mg Intravenous Q24H  . pantoprazole sodium  40 mg Per Tube Daily  . prasugrel  10 mg Oral Daily   Infusions:  . cisatracurium (NIMBEX) infusion 1.5 mcg/kg/min (04/19/14 0800)  . fentaNYL infusion INTRAVENOUS 200 mcg/hr (04/19/14 0800)  . midazolam (VERSED) infusion 4 mg/hr (04/19/14 0800)  . norepinephrine (LEVOPHED) Adult infusion 5 mcg/min (04/19/14 0800)   Assessment: 68 yo who was admitted for vfib arrest. Pt has been  immunosuppressed for temporal arteritis. Pt has develeped hypothermia. Vanc/levaquin ordered for PNA today.    Goal of Therapy:  Vancomycin trough level 15-20 mcg/ml  Plan:   Vanc 1500mg  IV q12 F/u with trough as needed

## 2014-04-19 NOTE — Progress Notes (Signed)
eLink Physician-Brief Progress Note Patient Name: Andre Moran DOB: 06-Oct-1946 MRN: [id number]  Date of Service  04/19/2014   HPI/Events of Note     eICU Interventions  Hypokalemia, repleted    Intervention Category Intermediate Interventions: Electrolyte abnormality - evaluation and management  MCQUAID, DOUGLAS 04/19/2014, 1:18 AM

## 2014-04-19 NOTE — Progress Notes (Signed)
Subjective:  Intubated, sedated, being rewarmed [facility] Nancy Fetter)  Objective:  Temp:  [89.6 F (32 C)-92.5 F (33.6 C)] 92.1 F (33.4 C) (06/17 1000) Pulse Rate:  [42-87] 85 (06/17 0900) Resp:  [9-24] 24 (06/17 1000) BP: (94-158)/(65-92) 94/90 mmHg (06/17 0842) SpO2:  [91 %-99 %] 92 % (06/17 1000) Arterial Line BP: (79-183)/(61-105) 100/66 mmHg (06/17 1000) FiO2 (%):  [40 %-70 %] 70 % (06/17 0900) Weight change:   Intake/Output from previous day: 06/16 0701 - 06/17 0700 In: 1683.9 [I.V.:933.9; IV Piggyback:750] Out: 5374 [Urine:1780]  Intake/Output from this shift: Total I/O In: 102.8 [I.V.:102.8] Out: 65 [Urine:65]  Physical Exam: General appearance: Intubated, sedated, paralyzed Neck: no adenopathy, no carotid bruit, no JVD, supple, symmetrical, trachea midline and thyroid not enlarged, symmetric, no tenderness/mass/nodules Lungs: clear to auscultation bilaterally Heart: regular rate and rhythm, S1, S2 normal, no murmur, click, rub or gallop Extremities: extremities normal, atraumatic, no cyanosis or edema  Lab Results: Results for orders placed during the hospital encounter of 04/19/2014 (from the past 48 hour(s))  CBG MONITORING, ED     Status: Abnormal   Collection Time    04/23/2014 10:43 PM      Result Value Ref Range   Glucose-Capillary 220 (*) 70 - 99 mg/dL  CBC     Status: Abnormal   Collection Time    05/01/2014 10:54 PM      Result Value Ref Range   WBC 15.3 (*) 4.0 - 10.5 K/uL   RBC 3.71 (*) 4.22 - 5.81 MIL/uL   Hemoglobin 12.0 (*) 13.0 - 17.0 g/dL   HCT 36.7 (*) 39.0 - 52.0 %   MCV 98.9  78.0 - 100.0 fL   MCH 32.3  26.0 - 34.0 pg   MCHC 32.7  30.0 - 36.0 g/dL   RDW 14.2  11.5 - 15.5 %   Platelets 338  150 - 400 K/uL  APTT     Status: None   Collection Time    04/10/2014 10:54 PM      Result Value Ref Range   aPTT 34  24 - 37 seconds  BASIC METABOLIC PANEL     Status: Abnormal   Collection Time    04/03/2014 10:54 PM      Result Value Ref Range   Sodium 141  137 - 147 mEq/L   Potassium 3.3 (*) 3.7 - 5.3 mEq/L   Chloride 103  96 - 112 mEq/L   CO2 14 (*) 19 - 32 mEq/L   Glucose, Bld 253 (*) 70 - 99 mg/dL   BUN 14  6 - 23 mg/dL   Creatinine, Ser 1.24  0.50 - 1.35 mg/dL   Calcium 8.2 (*) 8.4 - 10.5 mg/dL   GFR calc non Af Amer 58 (*) >90 mL/min   GFR calc Af Amer 68 (*) >90 mL/min   Comment: (NOTE)     The eGFR has been calculated using the CKD EPI equation.     This calculation has not been validated in all clinical situations.     eGFR's persistently <90 mL/min signify possible Chronic Kidney     Disease.  PROTIME-INR     Status: None   Collection Time    04/21/2014 10:54 PM      Result Value Ref Range   Prothrombin Time 14.3  11.6 - 15.2 seconds   INR 1.13  0.00 - 1.49  POCT ACTIVATED CLOTTING TIME     Status: None   Collection Time    04/12/2014  11:50 PM      Result Value Ref Range   Activated Clotting Time 188    POCT ACTIVATED CLOTTING TIME     Status: None   Collection Time    04/18/14 12:03 AM      Result Value Ref Range   Activated Clotting Time 420    POCT I-STAT 3, ART BLOOD GAS (G3+)     Status: Abnormal   Collection Time    04/18/14 12:04 AM      Result Value Ref Range   pH, Arterial 7.100 (*) 7.350 - 7.450   pCO2 arterial 52.9 (*) 35.0 - 45.0 mmHg   pO2, Arterial 160.0 (*) 80.0 - 100.0 mmHg   Bicarbonate 16.4 (*) 20.0 - 24.0 mEq/L   TCO2 18  0 - 100 mmol/L   O2 Saturation 99.0     Acid-base deficit 13.0 (*) 0.0 - 2.0 mmol/L   Sample type ARTERIAL     Comment NOTIFIED PHYSICIAN    POCT I-STAT 3, ART BLOOD GAS (G3+)     Status: Abnormal   Collection Time    04/18/14 12:30 AM      Result Value Ref Range   pH, Arterial 7.261 (*) 7.350 - 7.450   pCO2 arterial 43.5  35.0 - 45.0 mmHg   pO2, Arterial 200.0 (*) 80.0 - 100.0 mmHg   Bicarbonate 19.5 (*) 20.0 - 24.0 mEq/L   TCO2 21  0 - 100 mmol/L   O2 Saturation 100.0     Acid-base deficit 7.0 (*) 0.0 - 2.0 mmol/L   Sample type ARTERIAL    MRSA PCR SCREENING      Status: None   Collection Time    04/18/14  1:06 AM      Result Value Ref Range   MRSA by PCR NEGATIVE  NEGATIVE   Comment:            The GeneXpert MRSA Assay (FDA     approved for NASAL specimens     only), is one component of a     comprehensive MRSA colonization     surveillance program. It is not     intended to diagnose MRSA     infection nor to guide or     monitor treatment for     MRSA infections.  GLUCOSE, CAPILLARY     Status: Abnormal   Collection Time    04/18/14  1:17 AM      Result Value Ref Range   Glucose-Capillary 140 (*) 70 - 99 mg/dL  GLUCOSE, CAPILLARY     Status: Abnormal   Collection Time    04/18/14  2:24 AM      Result Value Ref Range   Glucose-Capillary 166 (*) 70 - 99 mg/dL  BASIC METABOLIC PANEL     Status: Abnormal   Collection Time    04/18/14  2:30 AM      Result Value Ref Range   Sodium 142  137 - 147 mEq/L   Potassium 3.5 (*) 3.7 - 5.3 mEq/L   Chloride 104  96 - 112 mEq/L   CO2 20  19 - 32 mEq/L   Glucose, Bld 173 (*) 70 - 99 mg/dL   BUN 13  6 - 23 mg/dL   Creatinine, Ser 0.79  0.50 - 1.35 mg/dL   Comment: DELTA CHECK NOTED   Calcium 7.8 (*) 8.4 - 10.5 mg/dL   GFR calc non Af Amer >90  >90 mL/min   GFR calc Af Amer >90  >90  mL/min   Comment: (NOTE)     The eGFR has been calculated using the CKD EPI equation.     This calculation has not been validated in all clinical situations.     eGFR's persistently <90 mL/min signify possible Chronic Kidney     Disease.  MAGNESIUM     Status: None   Collection Time    04/18/14  2:30 AM      Result Value Ref Range   Magnesium 1.7  1.5 - 2.5 mg/dL  PHOSPHORUS     Status: Abnormal   Collection Time    04/18/14  2:30 AM      Result Value Ref Range   Phosphorus 1.4 (*) 2.3 - 4.6 mg/dL  TROPONIN I     Status: Abnormal   Collection Time    04/18/14  2:30 AM      Result Value Ref Range   Troponin I >20.00 (*) <0.30 ng/mL   Comment:            Due to the release kinetics of cTnI,     a  negative result within the first hours     of the onset of symptoms does not rule out     myocardial infarction with certainty.     If myocardial infarction is still suspected,     repeat the test at appropriate intervals.     CRITICAL RESULT CALLED TO, READ BACK BY AND VERIFIED WITHDuanne Limerick, RN [id number] [phone] [facility]  LACTIC ACID, PLASMA     Status: None   Collection Time    04/18/14  2:30 AM      Result Value Ref Range   Lactic Acid, Venous 1.6  0.5 - 2.2 mmol/L  CBC     Status: Abnormal   Collection Time    04/18/14  2:30 AM      Result Value Ref Range   WBC 21.3 (*) 4.0 - 10.5 K/uL   RBC 4.25  4.22 - 5.81 MIL/uL   Hemoglobin 14.0  13.0 - 17.0 g/dL   HCT 41.3  39.0 - 52.0 %   MCV 97.2  78.0 - 100.0 fL   MCH 32.9  26.0 - 34.0 pg   MCHC 33.9  30.0 - 36.0 g/dL   RDW 14.3  11.5 - 15.5 %   Platelets 344  150 - 400 K/uL  GLUCOSE, CAPILLARY     Status: Abnormal   Collection Time    04/18/14  3:40 AM      Result Value Ref Range   Glucose-Capillary 189 (*) 70 - 99 mg/dL  POCT I-STAT 3, ART BLOOD GAS (G3+)     Status: Abnormal   Collection Time    04/18/14  4:17 AM      Result Value Ref Range   pH, Arterial 7.431  7.350 - 7.450   pCO2 arterial 24.8 (*) 35.0 - 45.0 mmHg   pO2, Arterial 58.0 (*) 80.0 - 100.0 mmHg   Bicarbonate 17.1 (*) 20.0 - 24.0 mEq/L   TCO2 18  0 - 100 mmol/L   O2 Saturation 95.0     Acid-base deficit 7.0 (*) 0.0 - 2.0 mmol/L   Patient temperature 33.4 C     Collection site RADIAL, ALLEN'S TEST ACCEPTABLE     Drawn by RT     Sample type ARTERIAL    GLUCOSE, CAPILLARY     Status: Abnormal   Collection Time    04/18/14  4:39 AM  Result Value Ref Range   Glucose-Capillary 209 (*) 70 - 99 mg/dL  POCT I-STAT, CHEM 8     Status: Abnormal   Collection Time    04/18/14  4:44 AM      Result Value Ref Range   Sodium 137  137 - 147 mEq/L   Potassium 3.3 (*) 3.7 - 5.3 mEq/L   Chloride 114 (*) 96 - 112 mEq/L   BUN 11  6 - 23 mg/dL   Creatinine, Ser 0.70   0.50 - 1.35 mg/dL   Glucose, Bld 230 (*) 70 - 99 mg/dL   Calcium, Ion 1.15  1.13 - 1.30 mmol/L   TCO2 17  0 - 100 mmol/L   Hemoglobin 16.0  13.0 - 17.0 g/dL   HCT 47.0  39.0 - 52.0 %  GLUCOSE, CAPILLARY     Status: Abnormal   Collection Time    04/18/14  5:25 AM      Result Value Ref Range   Glucose-Capillary 225 (*) 70 - 99 mg/dL  POCT I-STAT, CHEM 8     Status: Abnormal   Collection Time    04/18/14  6:34 AM      Result Value Ref Range   Sodium 136 (*) 137 - 147 mEq/L   Potassium 5.1  3.7 - 5.3 mEq/L   Chloride 111  96 - 112 mEq/L   BUN 11  6 - 23 mg/dL   Creatinine, Ser 0.50  0.50 - 1.35 mg/dL   Glucose, Bld 277 (*) 70 - 99 mg/dL   Calcium, Ion 1.09 (*) 1.13 - 1.30 mmol/L   TCO2 15  0 - 100 mmol/L   Hemoglobin 16.0  13.0 - 17.0 g/dL   HCT 47.0  39.0 - 52.0 %  GLUCOSE, CAPILLARY     Status: Abnormal   Collection Time    04/18/14  7:01 AM      Result Value Ref Range   Glucose-Capillary 210 (*) 70 - 99 mg/dL  BASIC METABOLIC PANEL     Status: Abnormal   Collection Time    04/18/14  8:00 AM      Result Value Ref Range   Sodium 140  137 - 147 mEq/L   Potassium 4.1  3.7 - 5.3 mEq/L   Chloride 104  96 - 112 mEq/L   CO2 19  19 - 32 mEq/L   Glucose, Bld 234 (*) 70 - 99 mg/dL   BUN 12  6 - 23 mg/dL   Creatinine, Ser 0.67  0.50 - 1.35 mg/dL   Calcium 8.4  8.4 - 10.5 mg/dL   GFR calc non Af Amer >90  >90 mL/min   GFR calc Af Amer >90  >90 mL/min   Comment: (NOTE)     The eGFR has been calculated using the CKD EPI equation.     This calculation has not been validated in all clinical situations.     eGFR's persistently <90 mL/min signify possible Chronic Kidney     Disease.  BASIC METABOLIC PANEL     Status: Abnormal   Collection Time    04/18/14  8:00 AM      Result Value Ref Range   Sodium 138  137 - 147 mEq/L   Potassium 4.0  3.7 - 5.3 mEq/L   Chloride 102  96 - 112 mEq/L   CO2 17 (*) 19 - 32 mEq/L   Glucose, Bld 225 (*) 70 - 99 mg/dL   BUN 12  6 - 23 mg/dL  Creatinine, Ser 0.67  0.50 - 1.35 mg/dL   Calcium 8.3 (*) 8.4 - 10.5 mg/dL   GFR calc non Af Amer >90  >90 mL/min   GFR calc Af Amer >90  >90 mL/min   Comment: (NOTE)     The eGFR has been calculated using the CKD EPI equation.     This calculation has not been validated in all clinical situations.     eGFR's persistently <90 mL/min signify possible Chronic Kidney     Disease.  APTT     Status: None   Collection Time    04/18/14  8:00 AM      Result Value Ref Range   aPTT 37  24 - 37 seconds   Comment:            IF BASELINE aPTT IS ELEVATED,     SUGGEST PATIENT RISK ASSESSMENT     BE USED TO DETERMINE APPROPRIATE     ANTICOAGULANT THERAPY.  TROPONIN I     Status: Abnormal   Collection Time    04/18/14  8:00 AM      Result Value Ref Range   Troponin I >20.00 (*) <0.30 ng/mL   Comment:            Due to the release kinetics of cTnI,     a negative result within the first hours     of the onset of symptoms does not rule out     myocardial infarction with certainty.     If myocardial infarction is still suspected,     repeat the test at appropriate intervals.     CRITICAL VALUE NOTED.  VALUE IS CONSISTENT WITH PREVIOUSLY REPORTED AND CALLED VALUE.  LACTIC ACID, PLASMA     Status: Abnormal   Collection Time    04/18/14  8:00 AM      Result Value Ref Range   Lactic Acid, Venous 2.6 (*) 0.5 - 2.2 mmol/L  PRO B NATRIURETIC PEPTIDE     Status: Abnormal   Collection Time    04/18/14  8:00 AM      Result Value Ref Range   Pro B Natriuretic peptide (BNP) 737.1 (*) 0 - 125 pg/mL  PLATELET COUNT     Status: None   Collection Time    04/18/14  8:00 AM      Result Value Ref Range   Platelets 373  150 - 400 K/uL  MAGNESIUM     Status: None   Collection Time    04/18/14  8:00 AM      Result Value Ref Range   Magnesium 2.4  1.5 - 2.5 mg/dL  PHOSPHORUS     Status: Abnormal   Collection Time    04/18/14  8:00 AM      Result Value Ref Range   Phosphorus 1.0 (*) 2.3 - 4.6 mg/dL    Comment: CRITICAL RESULT CALLED TO, READ BACK BY AND VERIFIED WITH:     CRAVEN J RN 04/18/14 0956 COSTELLO B  GLUCOSE, CAPILLARY     Status: Abnormal   Collection Time    04/18/14  8:10 AM      Result Value Ref Range   Glucose-Capillary 199 (*) 70 - 99 mg/dL  POCT I-STAT, CHEM 8     Status: Abnormal   Collection Time    04/18/14  8:13 AM      Result Value Ref Range   Sodium 141  137 - 147 mEq/L   Potassium 4.0  3.7 -  5.3 mEq/L   Chloride 103  96 - 112 mEq/L   BUN 12  6 - 23 mg/dL   Creatinine, Ser 0.70  0.50 - 1.35 mg/dL   Glucose, Bld 230 (*) 70 - 99 mg/dL   Calcium, Ion 1.12 (*) 1.13 - 1.30 mmol/L   TCO2 23  0 - 100 mmol/L   Hemoglobin 16.7  13.0 - 17.0 g/dL   HCT 49.0  39.0 - 52.0 %  GLUCOSE, CAPILLARY     Status: Abnormal   Collection Time    04/18/14  9:29 AM      Result Value Ref Range   Glucose-Capillary 206 (*) 70 - 99 mg/dL  GLUCOSE, CAPILLARY     Status: Abnormal   Collection Time    04/18/14 10:51 AM      Result Value Ref Range   Glucose-Capillary 190 (*) 70 - 99 mg/dL  POCT I-STAT 3, ART BLOOD GAS (G3+)     Status: Abnormal   Collection Time    04/18/14 11:27 AM      Result Value Ref Range   pH, Arterial 7.438  7.350 - 7.450   pCO2 arterial 23.1 (*) 35.0 - 45.0 mmHg   pO2, Arterial 47.0 (*) 80.0 - 100.0 mmHg   Bicarbonate 16.3 (*) 20.0 - 24.0 mEq/L   TCO2 17  0 - 100 mmol/L   O2 Saturation 91.0     Acid-base deficit 7.0 (*) 0.0 - 2.0 mmol/L   Patient temperature 32.8 C     Sample type ARTERIAL    GLUCOSE, CAPILLARY     Status: Abnormal   Collection Time    04/18/14 12:26 PM      Result Value Ref Range   Glucose-Capillary 185 (*) 70 - 99 mg/dL  POCT I-STAT, CHEM 8     Status: Abnormal   Collection Time    04/18/14 12:29 PM      Result Value Ref Range   Sodium 140  137 - 147 mEq/L   Potassium 3.2 (*) 3.7 - 5.3 mEq/L   Chloride 105  96 - 112 mEq/L   BUN 9  6 - 23 mg/dL   Creatinine, Ser 0.60  0.50 - 1.35 mg/dL   Glucose, Bld 188 (*) 70 - 99 mg/dL    Calcium, Ion 1.10 (*) 1.13 - 1.30 mmol/L   TCO2 18  0 - 100 mmol/L   Hemoglobin 16.3  13.0 - 17.0 g/dL   HCT 48.0  39.0 - 52.0 %  GLUCOSE, CAPILLARY     Status: Abnormal   Collection Time    04/18/14  2:10 PM      Result Value Ref Range   Glucose-Capillary 182 (*) 70 - 99 mg/dL  TROPONIN I     Status: Abnormal   Collection Time    04/18/14  2:26 PM      Result Value Ref Range   Troponin I >20.00 (*) <0.30 ng/mL   Comment:            Due to the release kinetics of cTnI,     a negative result within the first hours     of the onset of symptoms does not rule out     myocardial infarction with certainty.     If myocardial infarction is still suspected,     repeat the test at appropriate intervals.     CRITICAL VALUE NOTED.  VALUE IS CONSISTENT WITH PREVIOUSLY REPORTED AND CALLED VALUE.  GLUCOSE, CAPILLARY     Status: Abnormal  Collection Time    04/18/14  4:09 PM      Result Value Ref Range   Glucose-Capillary 200 (*) 70 - 99 mg/dL  POCT I-STAT, CHEM 8     Status: Abnormal   Collection Time    04/18/14  4:12 PM      Result Value Ref Range   Sodium 139  137 - 147 mEq/L   Potassium 3.7  3.7 - 5.3 mEq/L   Chloride 104  96 - 112 mEq/L   BUN 10  6 - 23 mg/dL   Creatinine, Ser 0.50  0.50 - 1.35 mg/dL   Glucose, Bld 215 (*) 70 - 99 mg/dL   Calcium, Ion 1.09 (*) 1.13 - 1.30 mmol/L   TCO2 18  0 - 100 mmol/L   Hemoglobin 16.3  13.0 - 17.0 g/dL   HCT 48.0  39.0 - 52.0 %  GLUCOSE, CAPILLARY     Status: Abnormal   Collection Time    04/18/14  6:52 PM      Result Value Ref Range   Glucose-Capillary 150 (*) 70 - 99 mg/dL  BASIC METABOLIC PANEL     Status: Abnormal   Collection Time    04/18/14  8:00 PM      Result Value Ref Range   Sodium 137  137 - 147 mEq/L   Potassium 3.5 (*) 3.7 - 5.3 mEq/L   Chloride 101  96 - 112 mEq/L   CO2 17 (*) 19 - 32 mEq/L   Glucose, Bld 166 (*) 70 - 99 mg/dL   BUN 11  6 - 23 mg/dL   Creatinine, Ser 0.53  0.50 - 1.35 mg/dL   Calcium 8.0 (*) 8.4 -  10.5 mg/dL   GFR calc non Af Amer >90  >90 mL/min   GFR calc Af Amer >90  >90 mL/min   Comment: (NOTE)     The eGFR has been calculated using the CKD EPI equation.     This calculation has not been validated in all clinical situations.     eGFR's persistently <90 mL/min signify possible Chronic Kidney     Disease.  GLUCOSE, CAPILLARY     Status: Abnormal   Collection Time    04/18/14  8:22 PM      Result Value Ref Range   Glucose-Capillary 156 (*) 70 - 99 mg/dL  GLUCOSE, CAPILLARY     Status: Abnormal   Collection Time    04/18/14 10:21 PM      Result Value Ref Range   Glucose-Capillary 144 (*) 70 - 99 mg/dL  GLUCOSE, CAPILLARY     Status: Abnormal   Collection Time    04/18/14 11:36 PM      Result Value Ref Range   Glucose-Capillary 145 (*) 70 - 99 mg/dL  BASIC METABOLIC PANEL     Status: Abnormal   Collection Time    04/19/14 12:00 AM      Result Value Ref Range   Sodium 137  137 - 147 mEq/L   Potassium 3.4 (*) 3.7 - 5.3 mEq/L   Chloride 99  96 - 112 mEq/L   CO2 18 (*) 19 - 32 mEq/L   Glucose, Bld 138 (*) 70 - 99 mg/dL   BUN 11  6 - 23 mg/dL   Creatinine, Ser 0.55  0.50 - 1.35 mg/dL   Calcium 8.4  8.4 - 10.5 mg/dL   GFR calc non Af Amer >90  >90 mL/min   GFR calc Af Amer >90  >90 mL/min  Comment: (NOTE)     The eGFR has been calculated using the CKD EPI equation.     This calculation has not been validated in all clinical situations.     eGFR's persistently <90 mL/min signify possible Chronic Kidney     Disease.  GLUCOSE, CAPILLARY     Status: None   Collection Time    04/19/14  4:00 AM      Result Value Ref Range   Glucose-Capillary 93  70 - 99 mg/dL  CBC     Status: Abnormal   Collection Time    04/19/14  4:10 AM      Result Value Ref Range   WBC 18.6 (*) 4.0 - 10.5 K/uL   RBC 4.92  4.22 - 5.81 MIL/uL   Hemoglobin 16.2  13.0 - 17.0 g/dL   HCT 47.0  39.0 - 52.0 %   MCV 95.5  78.0 - 100.0 fL   MCH 32.9  26.0 - 34.0 pg   MCHC 34.5  30.0 - 36.0 g/dL   RDW  14.7  11.5 - 15.5 %   Platelets 370  150 - 400 K/uL  HEPATIC FUNCTION PANEL     Status: Abnormal   Collection Time    04/19/14  4:10 AM      Result Value Ref Range   Total Protein 6.9  6.0 - 8.3 g/dL   Albumin 3.3 (*) 3.5 - 5.2 g/dL   AST 249 (*) 0 - 37 U/L   ALT 201 (*) 0 - 53 U/L   Alkaline Phosphatase 99  39 - 117 U/L   Total Bilirubin 0.4  0.3 - 1.2 mg/dL   Bilirubin, Direct <0.2  0.0 - 0.3 mg/dL   Indirect Bilirubin NOT CALCULATED  0.3 - 0.9 mg/dL  MAGNESIUM     Status: None   Collection Time    04/19/14  4:10 AM      Result Value Ref Range   Magnesium 1.7  1.5 - 2.5 mg/dL  PHOSPHORUS     Status: None   Collection Time    04/19/14  4:10 AM      Result Value Ref Range   Phosphorus 2.7  2.3 - 4.6 mg/dL  BASIC METABOLIC PANEL     Status: Abnormal   Collection Time    04/19/14  4:10 AM      Result Value Ref Range   Sodium 136 (*) 137 - 147 mEq/L   Potassium 3.7  3.7 - 5.3 mEq/L   Chloride 103  96 - 112 mEq/L   CO2 17 (*) 19 - 32 mEq/L   Glucose, Bld 103 (*) 70 - 99 mg/dL   BUN 11  6 - 23 mg/dL   Creatinine, Ser 0.43 (*) 0.50 - 1.35 mg/dL   Calcium 8.3 (*) 8.4 - 10.5 mg/dL   GFR calc non Af Amer >90  >90 mL/min   GFR calc Af Amer >90  >90 mL/min   Comment: (NOTE)     The eGFR has been calculated using the CKD EPI equation.     This calculation has not been validated in all clinical situations.     eGFR's persistently <90 mL/min signify possible Chronic Kidney     Disease.  GLUCOSE, CAPILLARY     Status: Abnormal   Collection Time    04/19/14  8:08 AM      Result Value Ref Range   Glucose-Capillary 104 (*) 70 - 99 mg/dL  POCT I-STAT, CHEM 8     Status: Abnormal  Collection Time    04/19/14  8:11 AM      Result Value Ref Range   Sodium 136 (*) 137 - 147 mEq/L   Potassium 4.9  3.7 - 5.3 mEq/L   Chloride 111  96 - 112 mEq/L   BUN 9  6 - 23 mg/dL   Creatinine, Ser 0.50  0.50 - 1.35 mg/dL   Glucose, Bld 110 (*) 70 - 99 mg/dL   Calcium, Ion 1.09 (*) 1.13 - 1.30  mmol/L   TCO2 18  0 - 100 mmol/L   Hemoglobin 17.7 (*) 13.0 - 17.0 g/dL   HCT 52.0  39.0 - 52.0 %    Imaging: Imaging results have been reviewed  Assessment/Plan:   1. Active Problems: 2.   Cardiac arrest 3.   Acute respiratory failure with hypoxia 4.   Chronic use of steroids 5.   Bradycardia 6. Hypotension 7. Hypoxia ? PNA  Time Spent Directly with Patient:  30 minutes  Length of Stay:  LOS: 2 days   1. STEMI- POD#1 Ant STEMI Rx with acute intervention of LAD with DES B[facility] Knack). Severe LV dysfunction. [employer]. Being rewarmed. On low dose Levo with SBP in 100 range (being weaned). FIO2 increased to 70% with decreasing Sats (PCCM managing). CVP low and UOP decreased ---> getting albumin +/- IVF bolus.   2. CGS- As above on pressors  3. Decreased UOP- ? Pt dry vs renal injury. SCr ok. Not getting diuretics and CVP low. PCCM addressing.  4. Brady/PVC-  HR increased, no further arrhythmias  5. Decreasing sats- Pulm edema vs PNA on CXR. PCCM to start empiric ATBX ? Aspiration PNA .  Lorretta Harp 04/19/2014, 11:09 AM

## 2014-04-19 NOTE — Progress Notes (Signed)
Left IO removed without complications. Site WML, vaseline gauze dsg applied no bleeding noted.

## 2014-04-19 NOTE — Progress Notes (Signed)
eLink Physician-Brief Progress Note Patient Name: Andre Moran DOB: 07-03-1946 MRN: [id number]  Date of Service  04/19/2014   HPI/Events of Note   Refractory status  eICU Interventions  Versed coma Ativan int Keppra/vimpat Add valproate   Intervention Category Major Interventions: Seizures - evaluation and management  ALVA,RAKESH V. 04/19/2014, 10:21 PM

## 2014-04-19 NOTE — Progress Notes (Signed)
Patient saturation dropped to 88%. Patient lavaged and FIO2 increased to 60% without any change. Patient then increased to 70% and sats rise to 92%. RT will continue to monitor.

## 2014-04-20 ENCOUNTER — Inpatient Hospital Stay (HOSPITAL_COMMUNITY): Payer: Medicare Other

## 2014-04-20 LAB — GLUCOSE, CAPILLARY: Glucose-Capillary: 118 mg/dL — ABNORMAL HIGH (ref 70–99)

## 2014-04-20 MED ORDER — SODIUM CHLORIDE 0.9 % IV SOLN
200.0000 mg | Freq: Two times a day (BID) | INTRAVENOUS | Status: DC
  Filled 2014-04-20: qty 20

## 2014-04-20 MED ORDER — SODIUM CHLORIDE 0.9 % IV SOLN
1000.0000 mg | Freq: Once | INTRAVENOUS | Status: DC
  Filled 2014-04-20: qty 10

## 2014-04-22 LAB — CULTURE, RESPIRATORY

## 2014-04-22 LAB — CULTURE, RESPIRATORY W GRAM STAIN

## 2014-04-25 NOTE — Discharge Summary (Signed)
Andre Moran was a 68 y.o. male admitted on 04/25/2014 with ventricular fibrillation.  He had ROSC after 15 minutes.  He was taken for cardiac cath emergently for STEMI and had stenting to his LAD.  He was started on hypothermia protocol.  After rewarming he developed shock, and right lung infiltrate.  He was started on pressors and antibiotics.  He also developed refractory seizures.  He was started on AED's and neurology was consulted.  Continuous EEG showed persistent seizures in spite of being on multiple AED's.  Condition was d/w pt's family.  The did not want to continue aggressive measures with no hope for neurologic recovery.  Decision was made for DNR status and transition to comfort care.  He was subsequently extubated, and expired on 05/02/2014 at 1:45 AM.  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

## 2014-05-03 NOTE — ED Provider Notes (Signed)
I saw and evaluated the patient, reviewed the resident's note and I agree with the findings and plan.   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 GCS 3 PTA with spontaneous respirations. ROS: unobtainable due to AMS Exam: GCS 3, equal rhonchi bilat intubated breathing spontaneously, fem pulses intact  ECG: Muse not immediately available, NSR, rate 87, normal axis, ST elevation V2-V4, no comparison available  Activated Code STEMI immediately after ECG obtained; hypothermia started; d/w PCCM, Cards interventionalist, and Cards on-call in ED; d/w family; Pt to cath lab emergently; ASA and heparin given in ED with verbal order to RN for stress dose steroid upon arrival to Cath Lab  CRITICAL CARE Performed by: Hurman Horn Total critical care time: Critical care time was exclusive of separately billable procedures and treating other patients. Critical care was necessary to treat or prevent imminent or life-threatening deterioration. Critical care was time spent personally by me on the following activities: development of treatment plan with patient and/or surrogate as well as nursing, discussions with consultants, evaluation of patient's response to treatment, examination of patient, obtaining history from patient or surrogate, ordering and performing treatments and interventions, ordering and review of laboratory studies, ordering and review of radiographic studies, pulse oximetry and re-evaluation of patient's condition.  ED Dx: Cardiac arrest due to VFib with ROSC STEMI Coma with GCS 3  Hurman Horn, MD 04/04/2014  1420

## 2014-05-03 NOTE — Progress Notes (Signed)
LTVM took down due to withdraw of care

## 2014-05-03 NOTE — Progress Notes (Signed)
eLink Physician-Brief Progress Note Patient Name: Zyel Lauriano DOB: 02-13-1946 MRN: [id number]  Date of Service  04/08/2014   HPI/Events of Note   Notified that Dr Thad Ranger discussed status, prognosis with family and they are desiring withdrawal of care at this time  eICU Interventions  Withdrawal orders entered.  All seizure meds were continued as potential comfort medications.    Intervention Category Intermediate Interventions: Other:  BYRUM,ROBERT S. 04/21/2014, 1:16 AM

## 2014-05-03 NOTE — Progress Notes (Signed)
Progress Note: Long term EEG connected and on initial review shows frequent electrographic seizures despite Keppra, Vimpat, Versed (at max doses), Fentanyl.  Conversation had at length with wife.  She made the decision that she would like care withdrawn at this time but did not wish him to have any further clinical seizure activity.   Pressors, IVF, antibiotics continued and anticonvulsants/sedation continued.  Ventilator to be discontinued as well per request of wife. CCM made aware.  Thana Farr, MD [facility] [phone]

## 2014-05-03 NOTE — Progress Notes (Signed)
LTVM hooked up

## 2014-05-03 NOTE — Progress Notes (Signed)
Wasted 27ml of versed, and of fentanyl in sink. Witnessed by Isabella Bowens, RN, and Gillis Ends, RN.

## 2014-05-03 NOTE — Progress Notes (Signed)
Patient removed from vent per End of Life Withdrawal Order. Family and RN at bedside.

## 2014-05-03 NOTE — Progress Notes (Signed)
At 0100 EEG was hooked up per neurology orders. Dr. Thad Ranger called to bedside to assess EEG. On arrival EEG showed seizure activity and wife called to bedside. Dr. Thad Ranger spoke with wife and family about EEG results and progression of care. Wife discussed with rest of family that no further treatment was to be done. At 0130 pressors and fluids were turned off. At 0140 ventilator was disconnected. Pt's wife and family were at bedside, CCM notified of withdrawal. At 0145 pt passed, Deretha Emory RN and Exie Parody RN auscultated heart for 2 minutes. Support given to wife and family, attending notified of time of death.

## 2014-05-03 NOTE — Progress Notes (Signed)
UR Completed.  Moran, Andre Jane [phone] 04/14/2014  

## 2014-05-03 DEATH — deceased

## 2014-10-12 ENCOUNTER — Encounter (HOSPITAL_COMMUNITY): Payer: Self-pay | Admitting: Cardiovascular Disease

## 2015-07-07 IMAGING — CR DG CHEST 1V PORT
1 series · 1 of 1 positions shown · non-contrast
Comparison: 04/17/2014, [DATE] hr.

CLINICAL DATA: Central line placement.

EXAM:
PORTABLE CHEST - 1 VIEW

[AP]
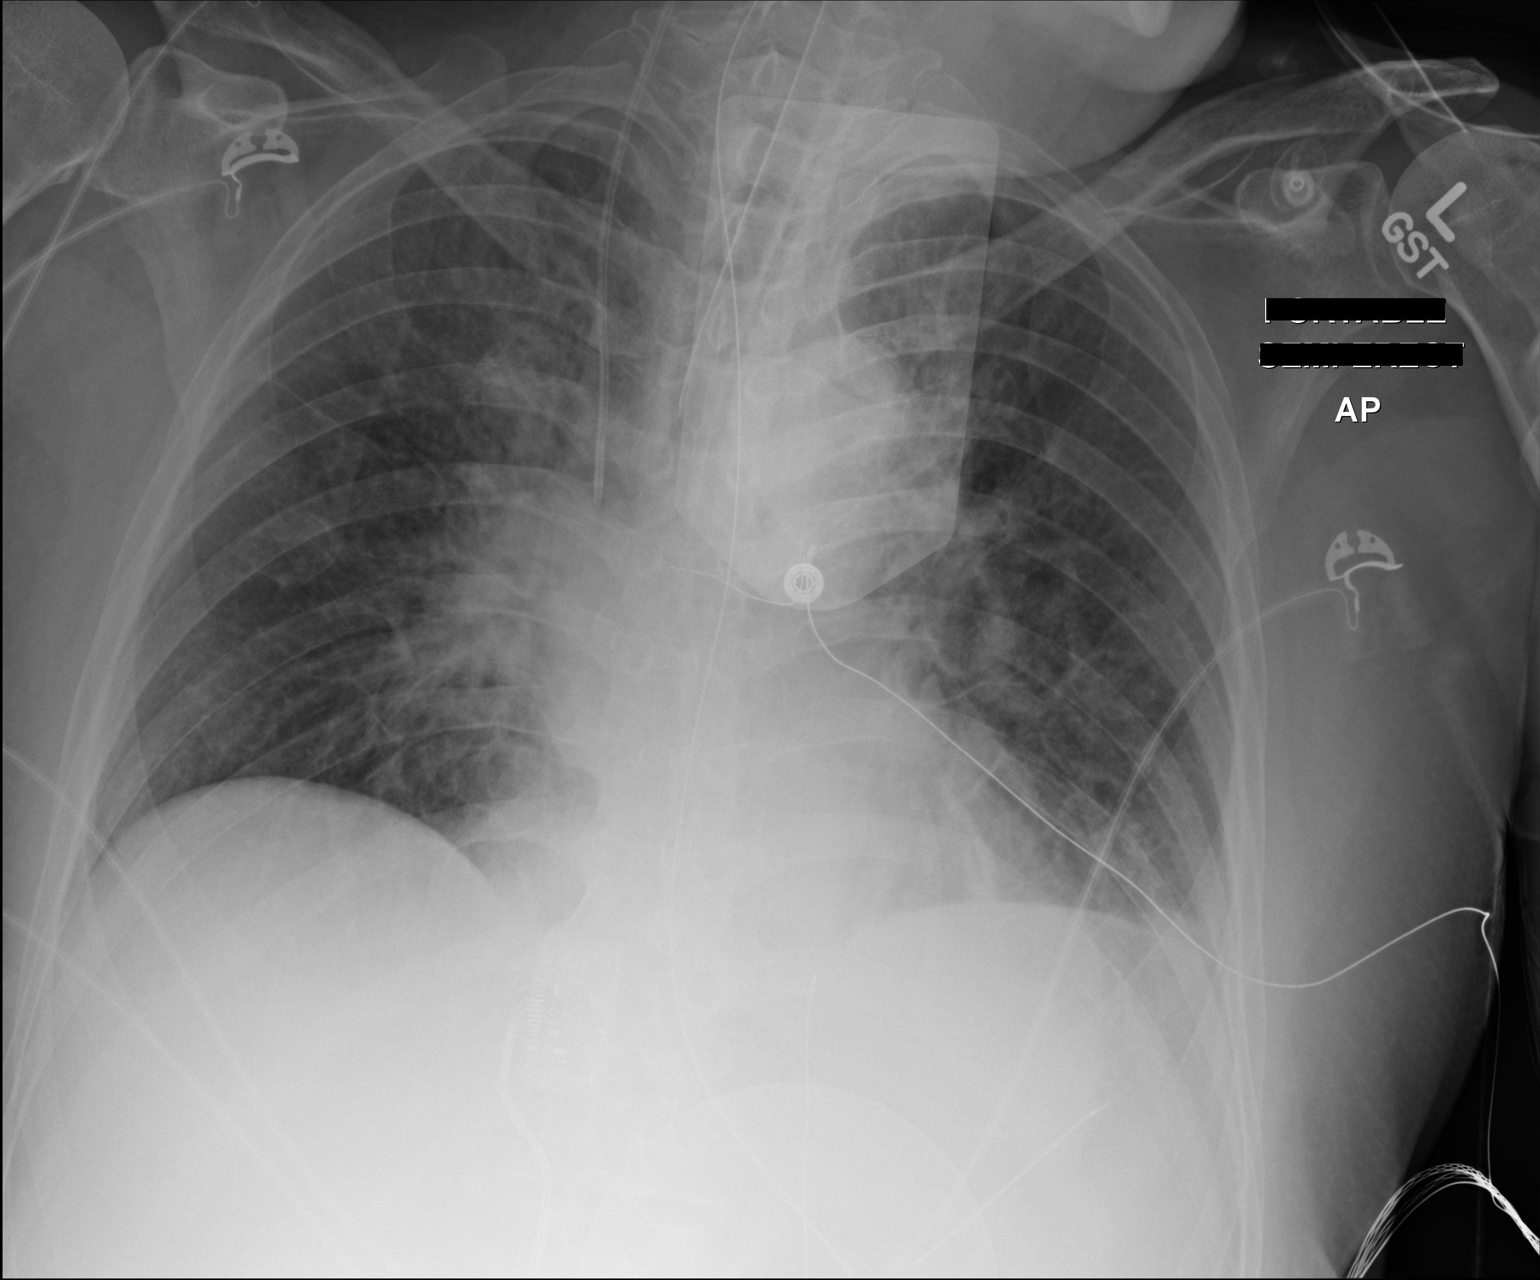

[1 of 1 positions shown; findings below may reference images not displayed]

FINDINGS: Right IJ central line is present with the tip in the mid SVC.
Pulmonary aeration has worsened with decreasing volumes and
increasing perihilar pulmonary edema. Interstitial pulmonary edema
has progressed alveolar edema at the bases. Endotracheal tube and
nasogastric tube unchanged defibrillator pads continued overlie the
chest.
IMPRESSION: 1. Uncomplicated right IJ central line placement.
2. Other support apparatus stable.
3. Worsening pulmonary edema/CHF.

## 2015-07-08 IMAGING — CT CT HEAD W/O CM
1 of 2 series · 14 of 30 positions shown, 18 images · non-contrast
Comparison: None.

CLINICAL DATA: Seizures , concern for anoxic damage , cerebral
edema

EXAM:
CT HEAD WITHOUT CONTRAST
TECHNIQUE: Contiguous axial images were obtained from the base of the skull
through the vertex without intravenous contrast.

[Series 2: — · axial · 0.49mm/px · z∈[+8,+198]mm · 14 of 44 slices shown, 18 images]
[im 3/44  brain]
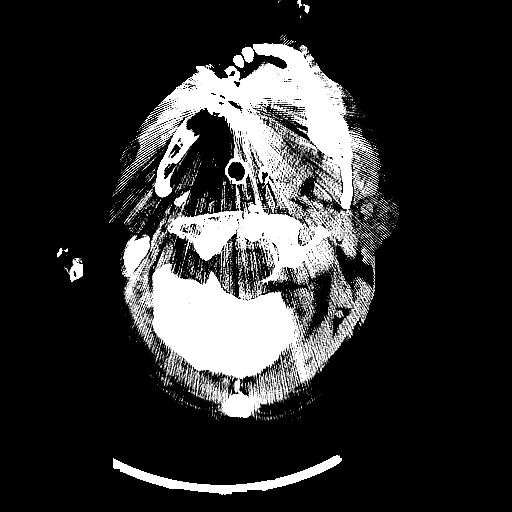
[im 3/44  bone]
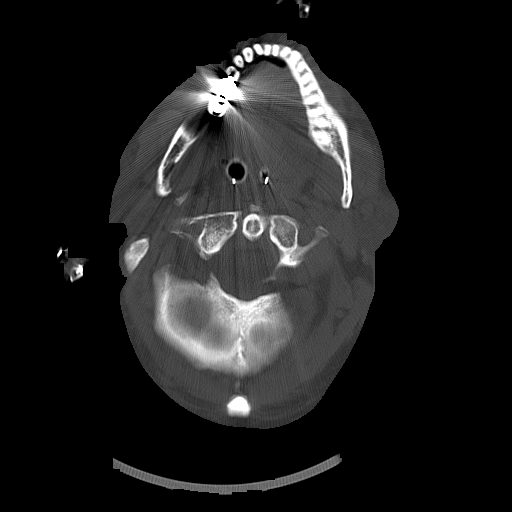
[im 6/44  brain]
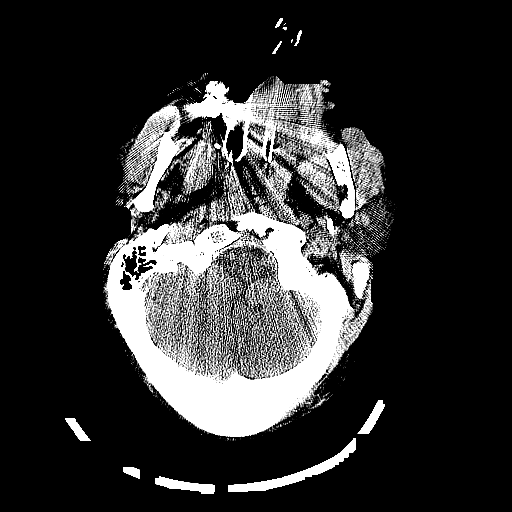
[im 9/44  brain]
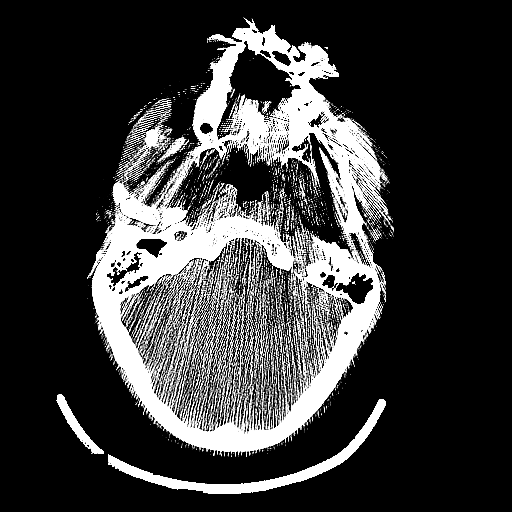
[im 12/44  brain]
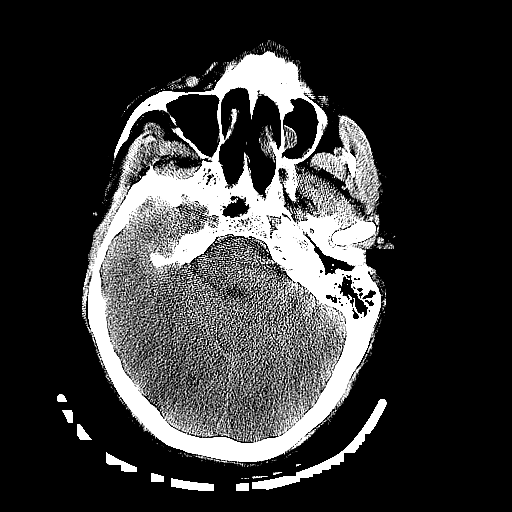
[im 15/44  brain]
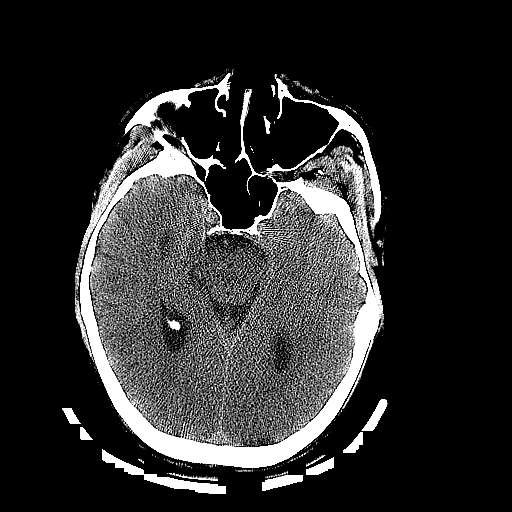
[im 15/44  bone]
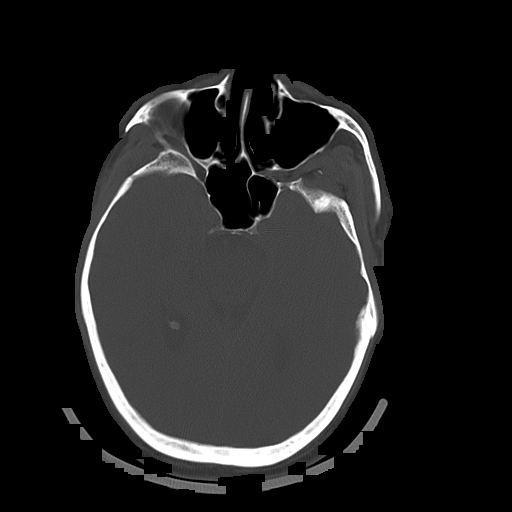
[im 18/44  brain]
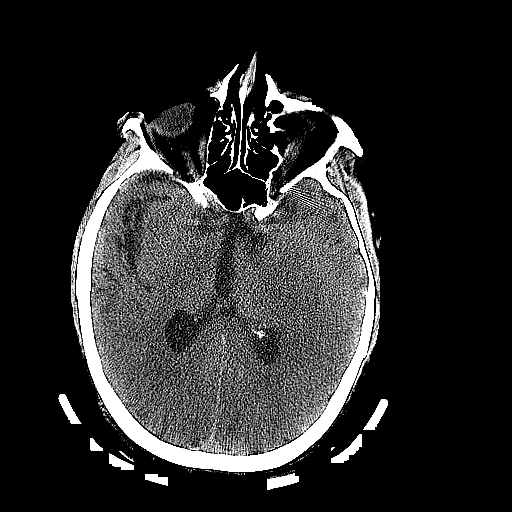
[im 21/44  brain]
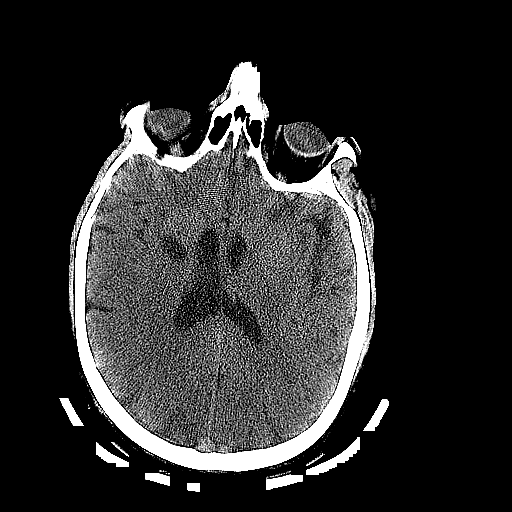
[im 23/44  brain]
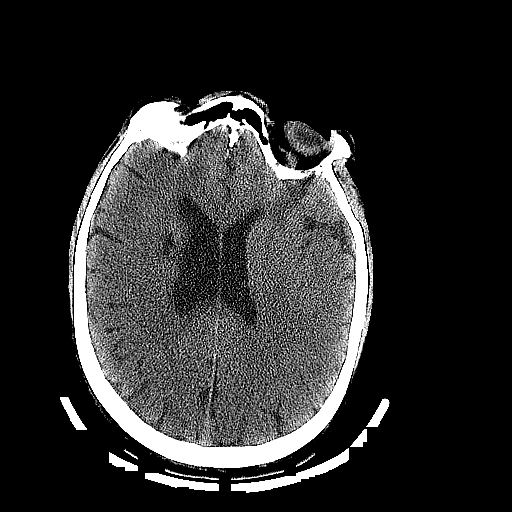
[im 26/44  brain]
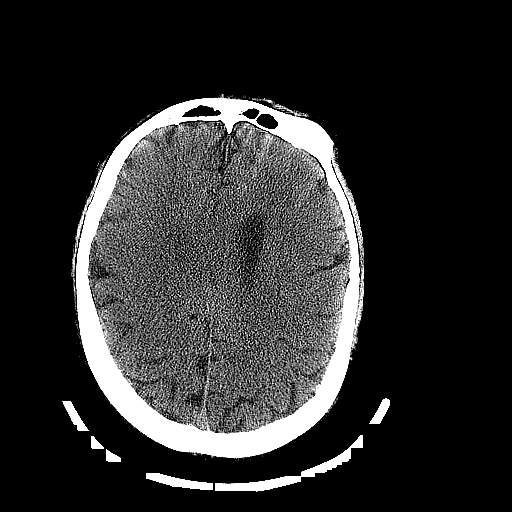
[im 26/44  bone]
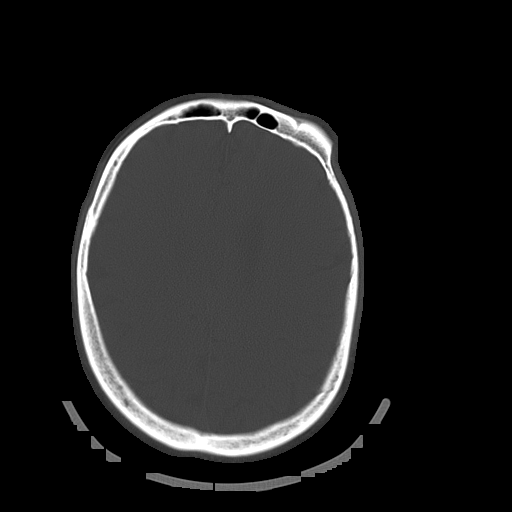
[im 29/44  brain]
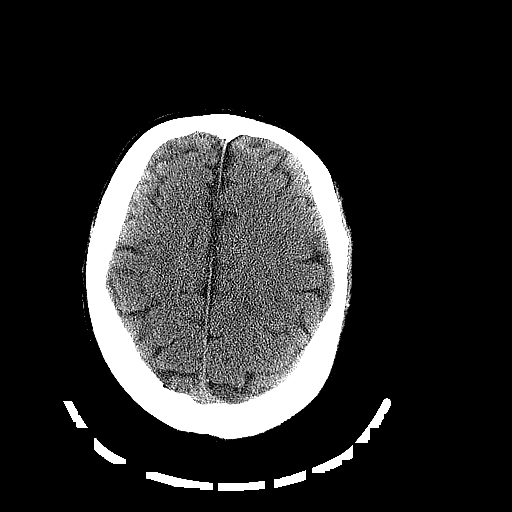
[im 32/44  brain]
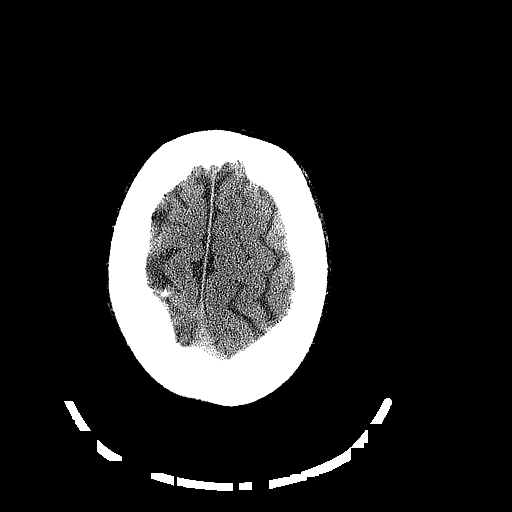
[im 35/44  brain]
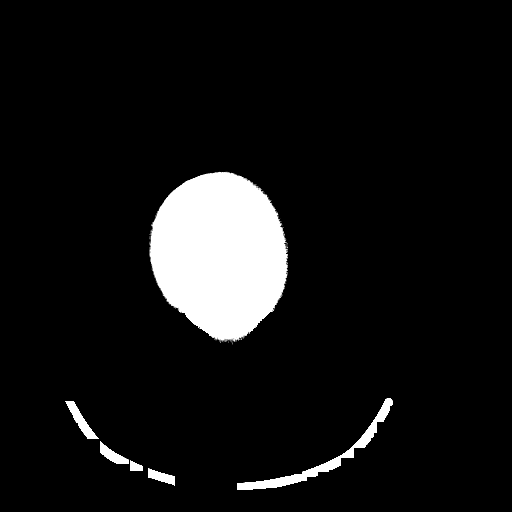
[im 38/44  brain]
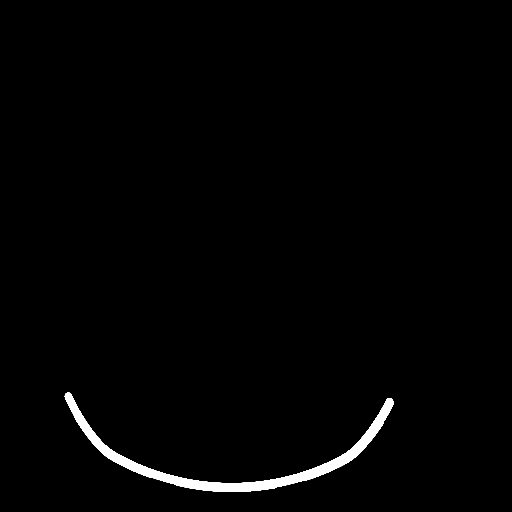
[im 38/44  bone]
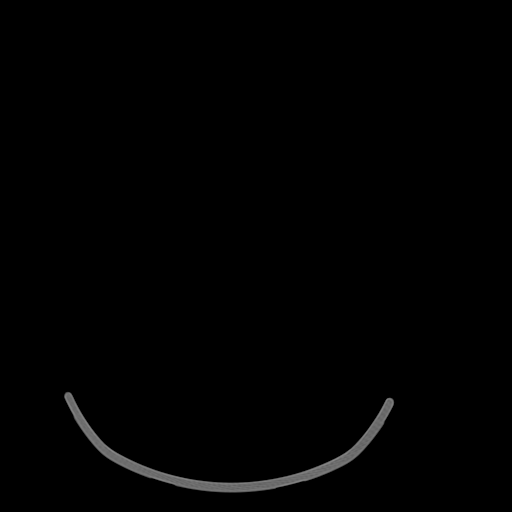
[im 41/44  brain]
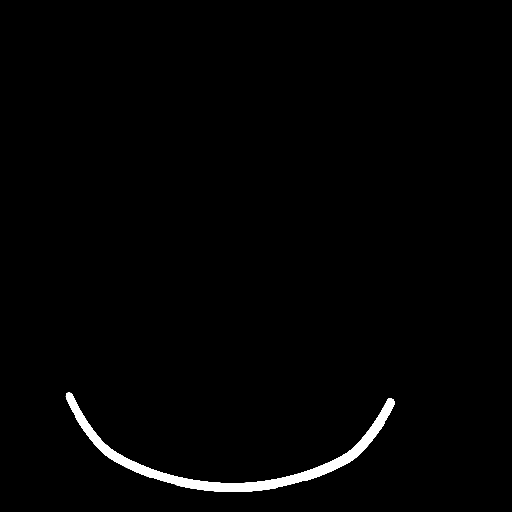

[14 of 30 positions shown; findings below may reference images not displayed]

FINDINGS: There is no evidence of acute hemorrhage, extra-axial fluid
collections nor mass effect. The ventricles and cisterns are patent.
Chronic region of infarction is appreciated within the right
putamen. Gray-white matter differentiation is poorly evaluated on
portable head CT. Further evaluation with dedicated conventional
head CT scanning is recommended. No focal or acute calvarial
abnormalities. Mucosal thickening is appreciated within the
maxillary sinuses. The mastoid air cells are patent. An endotracheal
and enteric tube is appreciated.
IMPRESSION: No evidence of acute hemorrhage. There is no evidence of mass
effect. Gray-white matter differentiation is poorly evaluated on the
portable head CT and further evaluation with dedicated conventional
head CT is recommended.

Chronic infarct right putamen.

## 2015-07-08 IMAGING — CR DG CHEST 1V PORT
1 series · 1 of 1 positions shown · non-contrast
Comparison: [DATE] [DATE], [DATE] [DATE] a.m.

CLINICAL DATA: Hypoxia, check endo tracheal tube placement.

EXAM:
PORTABLE CHEST - 1 VIEW

[AP]
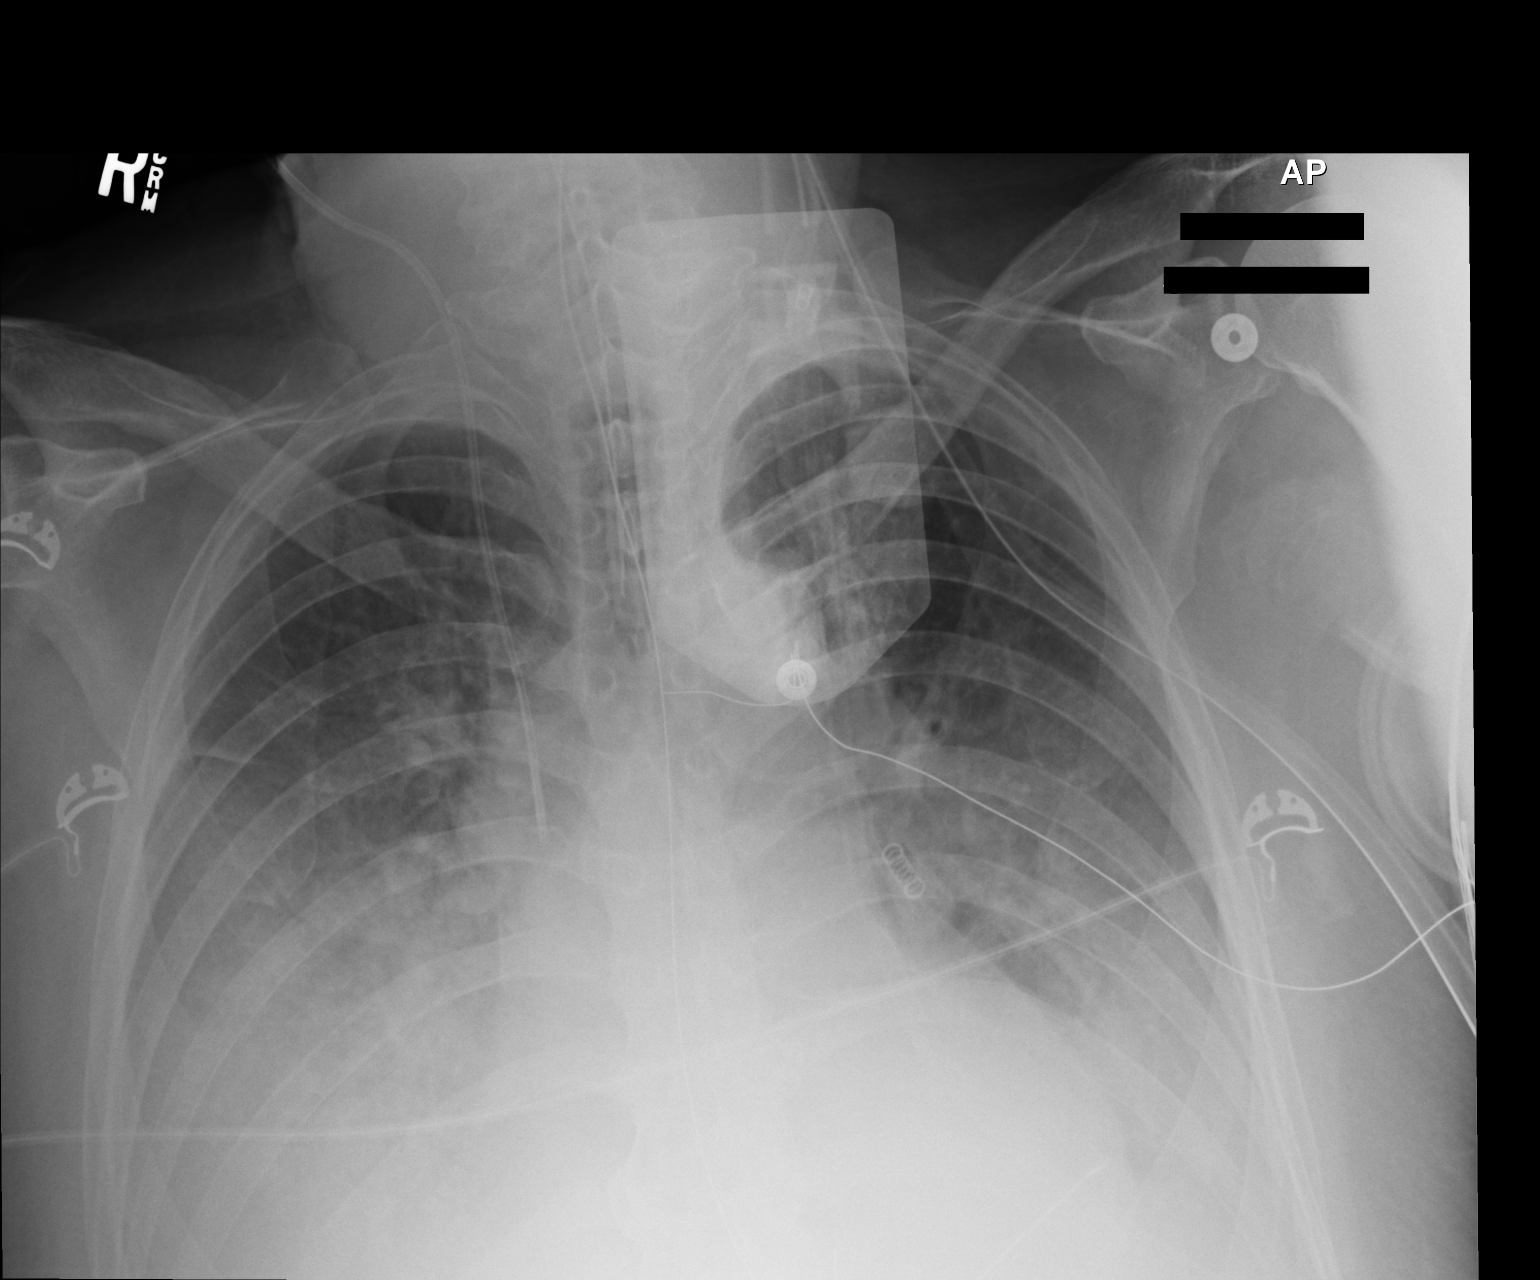

[1 of 1 positions shown; findings below may reference images not displayed]

FINDINGS: The heart size and mediastinal contours are stable. The heart size
is enlarged. There is pulmonary edema. There is consolidation of
both lung bases with bilateral pleural effusions unchanged.
Endotracheal tube is identified distal tip 4.3 cm from carina. Right
jugular central venous line and nasogastric tube are unchanged. The
visualized skeletal structures are stable.
IMPRESSION: Endotracheal tube in good position with distal tip 4.3 cm from
carina. Pulmonary edema. Consolidation of both lung bases with
bilateral pleural effusions.
